# Patient Record
Sex: Female | Born: 1995 | Race: White | Hispanic: No | Marital: Single
Health system: Midwestern US, Academic
[De-identification: ages and names within clinical notes are randomized; demographics above are authoritative.]

---
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
## 2013-02-11 ENCOUNTER — Inpatient Hospital Stay
Admit: 2013-02-11 | Discharge: 2013-02-11 | Disposition: A | Discharge status: Home / Self Care | Payer: PRIVATE HEALTH INSURANCE

## 2013-02-11 DIAGNOSIS — N12 Tubulo-interstitial nephritis, not specified as acute or chronic: Secondary | ICD-10-CM

## 2013-02-11 LAB — URINALYSIS W/RFL TO MICROSCOPIC
Bilirubin, UA: NEGATIVE
Glucose, UA: NEGATIVE mg/dL
Ketones, UA: 5 mg/dL
Nitrite, UA: POSITIVE
Protein, UA: 100 mg/dL
RBC, UA: 13 /HPF (ref 0–3)
Specific Gravity, UA: 1.029 (ref 1.005–1.035)
Squam Epithel, UA: 19 /HPF (ref 0–5)
Urobilinogen, UA: <2 mg/dL (ref 0.2–1.9)
WBC, UA: 86 /HPF (ref 0–5)
pH, UA: 5 (ref 5.0–8.0)

## 2013-02-11 LAB — BASIC METABOLIC PANEL
Anion Gap: 13 mmol/L (ref 3–16)
BUN: 8 mg/dL (ref 7–20)
CO2: 23 mmol/L (ref 21–33)
Calcium: 8.9 mg/dL (ref 8.9–10.4)
Chloride: 105 mmol/L (ref 98–110)
Creatinine: 0.72 mg/dL (ref 0.50–1.20)
GFR MDRD Af Amer: 126 See note.
GFR MDRD Non Af Amer: 104 See note.
Glucose: 87 mg/dL (ref 65–99)
Osmolality, Calculated: 290 mOsm/kg (ref 278–305)
Potassium: 3.8 mmol/L (ref 3.8–5.1)
Sodium: 141 mmol/L (ref 135–146)

## 2013-02-11 LAB — CBC
Hematocrit: 37.1 % (ref 35.0–45.0)
Hemoglobin: 12.1 g/dL (ref 11.7–15.5)
MCH: 26.7 pg (ref 27.0–33.0)
MCHC: 32.5 g/dL (ref 32.0–36.0)
MCV: 82.2 fL (ref 80.0–100.0)
MPV: 9.8 fL (ref 7.5–11.5)
Platelets: 245 10*3/uL (ref 140–400)
RBC: 4.51 10*6/uL (ref 3.80–5.10)
RDW: 13.8 % (ref 11.0–15.0)
WBC: 9.9 10*3/uL (ref 3.8–10.8)

## 2013-02-11 LAB — DIFFERENTIAL
Basophils Absolute: 50 /uL (ref 0–200)
Basophils Relative: 0.5 % (ref 0.0–1.0)
Eosinophils Absolute: 396 /uL (ref 15–500)
Eosinophils Relative: 4 % (ref 0.0–8.0)
Lymphocytes Absolute: 2881 /uL (ref 850–3900)
Lymphocytes Relative: 29.1 % (ref 15.0–45.0)
Monocytes Absolute: 891 /uL (ref 200–950)
Monocytes Relative: 9 % (ref 0.0–12.0)
Neutrophils Absolute: 5683 /uL (ref 1500–7800)
Neutrophils Relative: 57.4 % (ref 40.0–80.0)

## 2013-02-11 LAB — HCG URINE, QUALITATIVE: Preg Test, Ur: NEGATIVE

## 2013-02-11 LAB — URINE CULTURE: Culture Result: >100000

## 2013-02-11 MED ORDER — cephALEXin (KEFLEX) 500 MG capsule
500 | ORAL_CAPSULE | Freq: Four times a day (QID) | ORAL | Status: AC
Start: 2013-02-11 — End: 2013-02-25

## 2013-02-11 MED ORDER — cephALEXin (KEFLEX) capsule 500 mg
500 | Freq: Once | ORAL | Status: AC
Start: 2013-02-11 — End: 2013-02-11
  Administered 2013-02-11: 15:00:00 via ORAL

## 2013-02-11 MED FILL — CEPHALEXIN 500 MG CAPSULE: 500 500 MG | ORAL | Qty: 1

## 2013-02-11 NOTE — Unmapped (Signed)
Take all your antibiotics or the infection may come back.  See your gynecologist in 2 weeks to repeat the urine sample to confirm no infection.    Pyelonephritis, Adult  Pyelonephritis is a kidney infection. In general, there are 2 main types of pyelonephritis:  ?? Infections that come on quickly without any warning (acute pyelonephritis).  ?? Infections that persist for a long period of time (chronic pyelonephritis).  CAUSES   Two main causes of pyelonephritis are:  ?? Bacteria traveling from the bladder to the kidney. This is a problem especially in pregnant women. The urine in the bladder can become filled with bacteria from multiple causes, including:  ?? Inflammation of the prostate gland (prostatitis).  ?? Sexual intercourse in females.  ?? Bladder infection (cystitis).  ?? Bacteria traveling from the bloodstream to the tissue part of the kidney.  Problems that may increase your risk of getting a kidney infection include:  ?? Diabetes.  ?? Kidney stones or bladder stones.  ?? Cancer.  ?? Catheters placed in the bladder.  ?? Other abnormalities of the kidney or ureter.  SYMPTOMS   ?? Abdominal pain.  ?? Pain in the side or flank area.  ?? Fever.  ?? Chills.  ?? Upset stomach.  ?? Blood in the urine (dark urine).  ?? Frequent urination.  ?? Strong or persistent urge to urinate.  ?? Burning or stinging when urinating.  DIAGNOSIS   Your caregiver may diagnose your kidney infection based on your symptoms. A urine sample may also be taken.  TREATMENT   In general, treatment depends on how severe the infection is.   ?? If the infection is mild and caught early, your caregiver may treat you with oral antibiotics and send you home.  ?? If the infection is more severe, the bacteria may have gotten into the bloodstream. This will require intravenous (IV) antibiotics and a hospital stay. Symptoms may include:  ?? High fever.  ?? Severe flank pain.  ?? Shaking chills.  ?? Even after a hospital stay, your caregiver may require you to be on oral  antibiotics for a period of time.  ?? Other treatments may be required depending upon the cause of the infection.  HOME CARE INSTRUCTIONS   ?? Take your antibiotics as directed. Finish them even if you start to feel better.  ?? Make an appointment to have your urine checked to make sure the infection is gone.  ?? Drink enough fluids to keep your urine clear or pale yellow.  ?? Take medicines for the bladder if you have urgency and frequency of urination as directed by your caregiver.  SEEK IMMEDIATE MEDICAL CARE IF:   ?? You have a fever or persistent symptoms for more than 2-3 days.  ?? You have a fever and your symptoms suddenly get worse.  ?? You are unable to take your antibiotics or fluids.  ?? You develop shaking chills.  ?? You experience extreme weakness or fainting.  ?? There is no improvement after 2 days of treatment.  MAKE SURE YOU:  ?? Understand these instructions.  ?? Will watch your condition.  ?? Will get help right away if you are not doing well or get worse.  Document Released: 03/21/2005 Document Revised: 09/20/2011 Document Reviewed: 08/25/2010  ExitCare?? Patient Information ??2014 [place], [place].

## 2013-02-11 NOTE — Unmapped (Signed)
Pt c/o left flank pain since yesterday States just finished antibiotics recently for UTI Denies urinary c/o Also c/o increased thirst

## 2013-02-11 NOTE — Unmapped (Signed)
[facility] ED Note  Date of Service: 02/11/2013  Reason for Visit: Flank Pain      Patient History     HPI:  Jocelyn Schaefer is a 17 y.o. female who presents to the Emergency Department with a chief complaint of left-sided abdominal pain.  Patient with history of UTI 2 months ago finished antibiotics, states that left upper quadrant and left flank pain started yesterday, seem to be worse when she was rolling over while sleeping and lying on the left side.  She denies other associated symptoms such as fever chills nausea vomiting, change in urine or stools.  Denies any dysuria, hematuria, abnormal vaginal discharge.  She has had some irregular menstrual cycles recently and has seen her gynecologist for that.  No recent viral illness infections.  Denies any shortness breath cough or worsening with deep inspiration.       Past Medical History   Diagnosis Date   ??? Depression    ??? Anxiety    ??? Bipolar disorder    ??? Asthma        Past Surgical History   Procedure Laterality Date   ??? Tonsillectomy     ??? Tympanostomy tube placement         No family history on file.    Jocelyn Schaefer  reports that she has never smoked. She does not have any smokeless tobacco history on file. She reports that she does not drink alcohol or use illicit drugs.    Previous Medications    LAMOTRIGINE (LAMICTAL) 150 MG TABLET    Take 150 mg by mouth At bedtime.    UNKNOWN TO PATIENT    Birth control pill daily       Allergies:   Allergies as of 02/11/2013   ??? (No Known Allergies)       Review of Systems     ROS: Negative for fever, chills.  Negative for night sweats or significant weight changes.  Negative for vision change.  Negative for change in bowel or urinary pattern.  All other pertinent systems negative if not other wise specified in HPI.      Physical Exam     General: Well-developed well-nourished in no acute distress.  HEENT: Head normocephalic atraumatic.  Pupils equal and round.  External  ears and nose normal.  No cervical lymphadenopathy.  Neck: Full range of motion, supple.  Pulmonary: Lungs clear to auscultation bilaterally.  No wheezing, rhonchi, or rales.  Cardiac: Regular rate and rhythm.  No murmurs, rubs, or gallops.  Clear S1, S2.  Abdomen: Soft, tenderness to left upper quadrant and left lateral abdominal wall, without splenomegaly, no costovertebral angle tenderness.  No rebound, guarding, or organomegaly noted.  No peritoneal findings.  Musculoskeletal: Moving all extremities appropriately with full range of motion.  No obvious weakness noted.  No pitting edema noted.  Vascular: Palpable pulses to all extremities.  Skin: Warm and dry.  Neuro: Alert and oriented.  Cranial nerves II through XII without deficit.    Psych:  Normal affect, Normal judgement, Normal mood.  Normal affect, and behavior.    ED Course and MDM     MEDICAL DECISION MAKING    RECENT VITALS:  BP: 155/89 mmHg, Temp: 99.2 ??F (37.3 ??C), Heart Rate: 110, Resp: 16     RADIOLOGY:       LABS:   Labs Reviewed   URINALYSIS W/ REFLEX TO MICROSCOPIC - Abnormal; Notable for the following:     Color, UA Amber (*)  Clarity, UA Cloudy (*)     Protein, UA 100 (*)     Ketones, UA 5 (*)     Blood, UA Moderate (*)     Nitrite, UA Positive (*)     Leukocytes, UA Large (*)     RBC, UA 13 (*)     WBC, UA 86 (*)     Squam Epithel, UA 19 (*)     Bacteria, UA Moderate (*)     Mucus, UA Present (*)     All other components within normal limits   CBC - Abnormal; Notable for the following:     MCH 26.7 (*)     All other components within normal limits   URINE CULTURE   BASIC METABOLIC PANEL   DIFFERENTIAL   HCG URINE, QUALITATIVE       MEDS:  Medications   cephALEXin (KEFLEX) capsule 500 mg (not administered)       PROCEDURES: N/A    CONSULTS:  None    MEDICAL DECISION MAKING / ED COURSE:      The patient was seen and examined by myself and presented to Dr. Gae Gallop, MD, who also saw the patient.  IV access obtained.  Unremarkable however  her urinalysis shows large amount of white cells with large leukocytes and positive nitrites.  Culture is pending.  Patient is tolerating oral well and was given a dose of Keflex here.  We'll be discharged home with 14 days Keflex, encouraged to increase oral fluid intake and to follow up with her gynecologist in about 2 weeks have a repeat urinalysis performed to confirm clearance of UTI.  Patient is not pregnant, otherwise appears well, no need for IV antibiotics or admission to the hospital for this.  Encouraged to finish the entire course of antibiotics.      The patient tolerated their visit well.  They were seen and evaluated by the attending physician who agreed with the assessment and plan.  The patient and / or the family were informed of the results of any tests, a time was given to answer questions, a plan was proposed and they agreed with plan.      DISCHARGE DIAGNOSIS:  1. Pyelonephritis        PATIENT REFERRED TO:  No Pcp          [facility] Emergency Department  [address]  [place] [address]  [phone]    If symptoms worsen      DISCHARGE MEDICATIONS:  New Prescriptions    CEPHALEXIN (KEFLEX) 500 MG CAPSULE    Take 1 capsule (500 mg total) by mouth every 6 hours.         Critical Care Time (Attendings)           [place] Michalla Ringer, [place]  02/11/13 [phone]

## 2013-02-11 NOTE — Unmapped (Signed)
ED Attending Attestation Note    Date of service:  02/11/2013    This patient was seen by the mid-level provider.  I have seen and examined the patient, agree with the workup, evaluation, management and diagnosis.  The care plan has been discussed and I concur.      My assessment reveals a 17 y.o. female likely bilateral and otherwise healthy young female.  Discharged home abx

## 2013-02-13 NOTE — Unmapped (Signed)
Emergency Department Follow Up Note: Urine Culture    Marae Cottrell is a 17 y.o. female who had the following results from urine cultures collected while in the [facility] Emergency Department:     Culture Result Escherichia coli      Culture Result >100,000 cfu/mL      Resulting Agency [facility]      Culture & Susceptibility       Antibiotic  Organism Organism Organism       ESCHERICHIA COLI       AMPICILLIN  4 S Final         AMPICILLIN/SULBACTAM  <=2 S Final         CEFAZOLIN  <=4 S Final         CEFTRIAXONE  <=1 S Final         CIPROFLOXACIN  <=0.25 S Final         GENTAMICIN  <=1 S Final         NITROFURANTOIN  32 S Final         PIPERACILLIN/TAZOBACTAM  <=4 S Final         SULFA/TRIMETHOPRIM  <=20 S Final         TOBRAMYCIN  <=1 S Final              Patient was discharged on the following medications:   Discharge Medication List as of 02/11/2013  9:41 AM      START taking these medications    Details   cephALEXin (KEFLEX) 500 MG capsule Take 1 capsule (500 mg total) by mouth every 6 hours., Starting 02/11/2013, Last dose on Mon 02/25/13, Print              Patient has accurate antibiotic coverage for this UTI. No further action at this time.

## 2013-05-06 DIAGNOSIS — R45851 Suicidal ideations: Secondary | ICD-10-CM

## 2013-05-06 NOTE — Unmapped (Signed)
Bed: C24W  Expected date:   Expected time:   Means of arrival:   Comments:  [facility] intake SI

## 2013-05-06 NOTE — Unmapped (Signed)
Pt states i feel like i want to kill myself. States she has been feeling this way for a few weeks due to high anxiety. States she planned on drinking bleach, denies doing any harm to self.

## 2013-05-07 ENCOUNTER — Inpatient Hospital Stay
Admit: 2013-05-07 | Discharge: 2013-05-07 | Disposition: A | Discharge status: Other Acute Inpatient Hospital | Payer: PRIVATE HEALTH INSURANCE

## 2013-05-07 LAB — CBC
Hematocrit: 36.6 % (ref 35.0–45.0)
Hemoglobin: 11.9 g/dL (ref 11.7–15.5)
MCH: 26.3 pg (ref 27.0–33.0)
MCHC: 32.6 g/dL (ref 32.0–36.0)
MCV: 80.6 fL (ref 80.0–100.0)
MPV: 10.1 fL (ref 7.5–11.5)
Platelets: 301 10*3/uL (ref 140–400)
RBC: 4.54 10*6/uL (ref 3.80–5.10)
RDW: 13.5 % (ref 11.0–15.0)
WBC: 10.1 10*3/uL (ref 3.8–10.8)

## 2013-05-07 LAB — BASIC METABOLIC PANEL
Anion Gap: 8 mmol/L (ref 3–16)
BUN: 9 mg/dL (ref 7–25)
CO2: 25 mmol/L (ref 21–31)
Calcium: 9.5 mg/dL (ref 8.6–10.3)
Chloride: 105 mmol/L (ref 98–110)
Creatinine: 0.67 mg/dL (ref 0.60–1.30)
GFR MDRD Af Amer: 137 See note.
GFR MDRD Non Af Amer: 113 See note.
Glucose: 98 mg/dL (ref 70–100)
Osmolality, Calculated: 285 mosm/kg (ref 278–305)
Potassium: 3.9 mmol/L (ref 3.5–5.3)
Sodium: 138 mmol/L (ref 135–146)

## 2013-05-07 LAB — URINE DRUG SCREEN WITHOUT CONFIRMATION, STAT
Amphetamines UR, 1000 ng/mL Cutoff: NEGATIVE
Barbiturates UR, 300  ng/mL Cutoff: NEGATIVE
Benzodiazepines UR, 300 ng/mL Cutoff: NEGATIVE
Cocaine UR, 300 ng/mL Cutoff: NEGATIVE
MDMA URINE: NEGATIVE
Methadone, UR, 300 ng/mL Cutoff: NEGATIVE
Methamph, UR, 1000 ng/mL Cutoff: NEGATIVE
Opiates UR, 300 ng/mL Cutoff: NEGATIVE
Oxycodone UR: NEGATIVE
Phencyclidine (PCP) UR, 25 ng/mL Cutoff: NEGATIVE
THC UR, 50 ng/mL Cutoff: NEGATIVE
Tricyclic Antidepressants Screen,Urine 1000 ng/mL Cutoff: NEGATIVE

## 2013-05-07 LAB — URINE CULTURE: Culture Result: >100000

## 2013-05-07 LAB — URINALYSIS W/RFL TO MICROSCOPIC
Bilirubin, UA: NEGATIVE
Glucose, UA: NEGATIVE mg/dL
Ketones, UA: NEGATIVE mg/dL
Nitrite, UA: NEGATIVE
Protein, UA: 30 mg/dL
RBC, UA: 16 /HPF (ref 0–3)
Specific Gravity, UA: 1.025 (ref 1.005–1.035)
Squam Epithel, UA: 1 /HPF (ref 0–5)
Urobilinogen, UA: 4 mg/dL (ref 0.2–1.9)
WBC, UA: 1 /HPF (ref 0–5)
pH, UA: 7 (ref 5.0–8.0)

## 2013-05-07 LAB — SALICYLATE LEVEL: Salicylate Lvl: <3 mg/dL (ref 10–30)

## 2013-05-07 LAB — HCG URINE, QUALITATIVE: Preg Test, Ur: NEGATIVE

## 2013-05-07 LAB — ACETAMINOPHEN LEVEL: Acetaminophen Level: <10 ug/mL — ABNORMAL LOW (ref 10–30)

## 2013-05-07 MED ORDER — cephALEXin (KEFLEX) capsule 500 mg
500 | Freq: Once | ORAL | Status: AC
Start: 2013-05-07 — End: 2013-05-07
  Administered 2013-05-07: 07:00:00 500 mg via ORAL

## 2013-05-07 MED ORDER — sodium chloride 0.9 % 1,000 mL bolus
Freq: Once | INTRAVENOUS | Status: AC
Start: 2013-05-07 — End: 2013-05-07
  Administered 2013-05-07: 05:00:00 via INTRAVENOUS

## 2013-05-07 MED FILL — CEPHALEXIN 500 MG CAPSULE: 500 500 MG | ORAL | Qty: 1

## 2013-05-07 MED FILL — SODIUM CHLORIDE 0.9 % INTRAVENOUS SOLUTION: INTRAVENOUS | Qty: 1000

## 2013-05-07 NOTE — Unmapped (Signed)
Pt resting, friend at bedside, sitter for close obs. No needs voiced, no distress noted, call light in reach.

## 2013-05-07 NOTE — Unmapped (Signed)
Suicidal Feelings, How to Help Yourself  Everyone feels sad or unhappy at times, but depressing thoughts and feelings of hopelessness can lead to thoughts of suicide. It can seem as if life is too tough to handle. If you feel as though you have reached the point where suicide is the only answer, it is time to let someone know immediately.   HOW TO COPE AND PREVENT SUICIDE  ?? Let family, friends, teachers, or counselors know. Get help. Try not to isolate yourself from those who care about you. Even though you may not feel sociable, talk with someone every day. It is best if it is face-to-face. Remember, they will want to help you.  ?? Eat a regularly spaced and well-balanced diet.  ?? Get plenty of rest.  ?? Avoid alcohol and drugs because they will only make you feel worse and may also lower your inhibitions. Remove them from the home. If you are thinking of taking an overdose of your prescribed medicines, give your medicines to someone who can give them to you one day at a time. If you are on antidepressants, let your caregiver know of your feelings so he or she can provide a safer medicine, if that is a concern.  ?? Remove weapons or poisons from your home.  ?? Try to stick to routines. Follow a schedule and remind yourself that you have to keep that schedule every day.  ?? Set some realistic goals and achieve them. Make a list and cross things off as you go. Accomplishments give a sense of worth. Wait until you are feeling better before doing things you find difficult or unpleasant to do.  ?? If you are able, try to start exercising. Even half-hour periods of exercise each day will make you feel better. Getting out in the sun or into nature helps you recover from depression faster. If you have a favorite place to walk, take advantage of that.  ?? Increase safe activities that have always given you pleasure. This may include playing your favorite music, reading a good book, painting a picture, or playing your favorite  instrument. Do whatever takes your mind off your depression.  ?? Keep your living space well-lighted.  GET HELP  Contact a suicide hotline, crisis center, or local suicide prevention center for help right away. Local centers may include a hospital, clinic, community service organization, social service provider, or health department.  ?? Call your local emergency services (911 in the [place]).  ?? Call a suicide hotline:  ?? 1-800-273-TALK (1-800-273-8255) in the [place].  ?? 1-800-SUICIDE (1-800-784-2433) in the [place].  ?? 1-888-628-9454 in the [place] for Spanish-speaking counselors.  ?? 1-800-799-4TTY (1-800-799-4889) in the [place] for TTY users.  ?? Visit the following websites for information and help:  ?? National Suicide Prevention Lifeline: www.suicidepreventionlifeline.org  ?? Hopeline: www.hopeline.com  ?? American Foundation for Suicide Prevention: www.afsp.org  ?? For lesbian, gay, bisexual, transgender, or questioning youth, contact The Trevor Project:  ?? 1-866-4-U-TREVOR (1-866-488-7386) in the [place].  ?? www.thetrevorproject.org  ?? In [place], treatment resources are listed in each province with listings available under [employer] or similar titles. Another source for Crisis Centres by Province is located at http://www.suicideprevention.ca/in-crisis-now/find-a-crisis-centre-now/crisis-centres  Document Released: 09/25/2002 Document Revised: 06/13/2011 Document Reviewed: 02/13/2007  ExitCare?? Patient Information ??2014 [employer].

## 2013-05-07 NOTE — Unmapped (Signed)
Mobile care to arrive ~0315. Pt updated on POC. Pt continues to be closely monitored.

## 2013-05-07 NOTE — Unmapped (Signed)
Report to mobile care team

## 2013-05-07 NOTE — Unmapped (Signed)
Pt accepted at [facility] by Felipa Eth MD

## 2013-05-07 NOTE — Unmapped (Signed)
Pt received ~357ml of fluids, called this RN in and requested that fluids be d/c. Fluids d/c- MD aware

## 2013-05-07 NOTE — Unmapped (Signed)
Spoke with intake Charity fundraiser at [facility] center. She is to discuss pt with MD and call back.

## 2013-05-07 NOTE — Unmapped (Signed)
[facility] ED Note    Date of Service:  05/06/2013    Reason for Visit: Suicidal      Patient History     HPI: Jocelyn Schaefer is a 18 y.o. female who presents with suicidal ideation.  The patient states that she has had multiple episodes in the past suicidal ideation.  She carries a diagnosis of anxiety and depression according to her bipolar disease as well.  She states that she has had episodes of self harmful behavior in the past including cutting her wrists.  She has not attempted to kill herself and this way however she has done this to relieve stress.  The most recent was in August of 2014 per her description.  She describes most recently violent mood swings as well as increasing suicidal ideation.  Her plan at this time is something of that nature of taking sleeping pills and going to lay down in a cold creek behind her house or at work she contemplates drinking bleach.  She really has had some dysuria recently was seen in an urgent care who put her on ciprofloxacin which she has not filled her symptoms improve.  She denies any recent fevers.  She has never made a full attempt to commit suicide in the past.  She has been seen at [facility] or [facility] before.  She describes some mild lower bowel pain related to the urinary tract infection.    Past Medical History   Diagnosis Date   ??? Depression    ??? Anxiety    ??? Bipolar disorder    ??? Asthma        Past Surgical History   Procedure Laterality Date   ??? Tonsillectomy     ??? Tympanostomy tube placement          reports that she has been smoking.  She does not have any smokeless tobacco history on file. She reports that she does not drink alcohol or use illicit drugs.    Previous Medications    LAMOTRIGINE (LAMICTAL) 150 MG TABLET    Take 150 mg by mouth At bedtime.       Allergies:   Allergies as of 05/06/2013   ??? (No Known Allergies)       Review of Systems      A ten point review of systems was performed and found to be  negative with the exception of those things mentioned in the history of present illness.    Physical Exam     ED Triage Vitals   Vital Signs Group      Temp 05/06/13 2249 98.9 ??F (37.2 ??C)      Temp Source 05/06/13 2249 Oral      Heart Rate 05/06/13 2249 123      Heart Rate Source 05/06/13 2249 Monitor      Resp 05/06/13 2249 20      BP 05/06/13 2249 173/93 mmHg      BP Location 05/06/13 2249 Right arm      BP Method 05/06/13 2249 Automatic      Patient Position 05/06/13 2249 Lying   SpO2 05/06/13 2249 98 %   O2 Device 05/06/13 2249 None (Room air)       General: Well appearing.  No acute distress.  HEENT:  Normocephalic, atraumatic.  Moist mucus membranes.  Oropharynx is clear, uvula midline, tonsils are unremarkable.  Neck:  Trachea midline, no JVD, no meningismus   Eyes:  PERRL, EOMI, anicteric sclera  Psych:  Normal affect  MSK:  No long bone deformities, wrist scars  Pulm:  Clear to auscultation bilaterally.  No wheezes, rales, or rhonchi.  Cardiac: Mildly tachycardic rate and rhythm; no murmurs, rubs, or gallops. 2+pulses in the upper extremities.  Abdomen: soft, minimally tender in the low abdomen, no rebound, no guarding  Neuro:  Awake, alert, and oriented x 3.  Moving all extremities.  GCS 15.    Diagnostic Studies     All laboratory and radiologic examinations performed in the emergency department were reviewed.  Important findings are discussed below.    Labs:   Please see electronic medical record for any tests performed in the ED    Radiology:    Please see electronic medical record for any tests performed in the ED    EKG: (As interpreted by me in the absence of the cardiologist) normal sinus rhythm rate of 96 PR 1:30 QRS 82 QTC 444 normal axis no pathologic Q waves are progression no ST segment elevation no pathologic T wave inversions no interventricular conduction delay or dysrhythmia no Brugada pattern no delta waves no prolongation of the QTC or QRS      Emergency Department Procedures          Consultations         ED Course and MDM     Diagnosis:   1) suicidal ideation  2) UTI    Patient is well-appearing.  She is afebrile as his normal vital signs.  She has not an actual attempt to harm herself today she simply has significant ideation with plan.  Chest significant history for the same.  I do feel she does require further psychiatric evaluation and 72 hour hold has been placed on her.  She denies any actual self harming behavior and her laboratory and other workup studies and physical exam support this.  She continues to have urinary symptoms and has a urine which is not clearly infected but does show significant bacteriuria as such culture will be sent the patient will be treated.  Coingestion labs and other basic workup also demonstrates no concerning findings.  She will be transferred to the [facility] center at this time for further evaluation from a psychiatric perspective.      Critical Care Time (Attendings)           Kristine Garbe, MD  05/07/13 1610

## 2013-05-08 NOTE — Unmapped (Signed)
[facility] ED Culture/Lab follow-up  Final Result - Nothing to do.    urine culture: Mixed Skin/Urogenital Flora.

## 2013-08-25 DIAGNOSIS — R45851 Suicidal ideations: Secondary | ICD-10-CM

## 2013-08-26 ENCOUNTER — Inpatient Hospital Stay
Admit: 2013-08-26 | Discharge: 2013-08-26 | Disposition: A | Discharge status: Home / Self Care | Payer: PRIVATE HEALTH INSURANCE | Attending: Psychiatry

## 2013-08-26 ENCOUNTER — Inpatient Hospital Stay
Admit: 2013-08-26 | Discharge: 2013-08-26 | Disposition: A | Discharge status: Other Acute Inpatient Hospital | Payer: PRIVATE HEALTH INSURANCE

## 2013-08-26 DIAGNOSIS — F3130 Bipolar disorder, current episode depressed, mild or moderate severity, unspecified: Secondary | ICD-10-CM

## 2013-08-26 LAB — URINALYSIS W/RFL TO MICROSCOPIC
Bilirubin, UA: NEGATIVE
Glucose, UA: NEGATIVE mg/dL
Ketones, UA: NEGATIVE mg/dL
Nitrite, UA: NEGATIVE
Protein, UA: NEGATIVE mg/dL
RBC, UA: 7 /HPF (ref 0–3)
Specific Gravity, UA: 1.026 (ref 1.005–1.035)
Squam Epithel, UA: 5 /HPF (ref 0–5)
Trans Epithel, UA: <1 /HPF (ref 0–5)
Urobilinogen, UA: 2 mg/dL (ref 0.2–1.9)
WBC, UA: 2 /HPF (ref 0–5)
pH, UA: 5 (ref 5.0–8.0)

## 2013-08-26 LAB — CBC
Hematocrit: 37.9 % (ref 35.0–45.0)
Hemoglobin: 12.7 g/dL (ref 11.7–15.5)
MCH: 27.3 pg (ref 27.0–33.0)
MCHC: 33.5 g/dL (ref 32.0–36.0)
MCV: 81.4 fL (ref 80.0–100.0)
MPV: 9 fL (ref 7.5–11.5)
Platelets: 277 10*3/uL (ref 140–400)
RBC: 4.65 10*6/uL (ref 3.80–5.10)
RDW: 15.1 % (ref 11.0–15.0)
WBC: 10.6 10*3/uL (ref 3.8–10.8)

## 2013-08-26 LAB — BASIC METABOLIC PANEL
Anion Gap: 5 mmol/L (ref 3–16)
BUN: 10 mg/dL (ref 7–25)
CO2: 25 mmol/L (ref 21–33)
Calcium: 9.3 mg/dL (ref 8.6–10.3)
Chloride: 106 mmol/L (ref 98–110)
Creatinine: 0.73 mg/dL (ref 0.60–1.30)
GFR MDRD Af Amer: 123 See note.
GFR MDRD Non Af Amer: 102 See note.
Glucose: 102 mg/dL (ref 70–100)
Osmolality, Calculated: 281 mOsm/kg (ref 278–305)
Potassium: 4.1 mmol/L (ref 3.5–5.3)
Sodium: 136 mmol/L (ref 133–146)

## 2013-08-26 LAB — ACETAMINOPHEN LEVEL: Acetaminophen Level: <10 ug/mL (ref 10–30)

## 2013-08-26 LAB — DIFFERENTIAL
Basophils Absolute: 42 /uL (ref 0–200)
Basophils Relative: 0.4 % (ref 0.0–1.0)
Eosinophils Absolute: 435 /uL (ref 15–500)
Eosinophils Relative: 4.1 % (ref 0.0–8.0)
Lymphocytes Absolute: 3053 /uL (ref 850–3900)
Lymphocytes Relative: 28.8 % (ref 15.0–45.0)
Monocytes Absolute: 678 /uL (ref 200–950)
Monocytes Relative: 6.4 % (ref 0.0–12.0)
Neutrophils Absolute: 6392 /uL (ref 1500–7800)
Neutrophils Relative: 60.3 % (ref 40.0–80.0)

## 2013-08-26 LAB — URINE CULTURE

## 2013-08-26 LAB — HCG URINE, QUALITATIVE: Preg Test, Ur: NEGATIVE

## 2013-08-26 LAB — TSH: TSH: 1.82 u[IU]/mL (ref 0.34–5.60)

## 2013-08-26 LAB — CHLAMYDIA / GONORRHOEAE DNA URINE
Chlamydia Trachomatis DNA Urine: NEGATIVE
Neisseria gonorrhoeae DNA Urine: NEGATIVE

## 2013-08-26 LAB — SALICYLATE LEVEL: Salicylate Lvl: <3 mg/dL (ref 10–30)

## 2013-08-26 LAB — SYPHILIS MONITORING (RPR W TITER): RPR Monitoring Screen: NONREACTIVE

## 2013-08-26 NOTE — Unmapped (Signed)
72hr hold signed by Emergency planning/management officer; MD notified

## 2013-08-26 NOTE — Unmapped (Signed)
18 y.o.White or Caucasian female transferred from [place]. after locking herself in bathroom at work. Pt. states she was recently promoted to Control and instrumentation engineer at [employer] and became overwhelmed last night when employees called off on 3rd shift. Pt. states she is diagnosed bipolar and has had two psychiatric in patient admits at [facility].  Pt. States I became overwhelmed at work and started to have invasive suicidal thoughts so I locked myself in the bathroom. Pt. isn't sure if she is currently suicidal.   Pt. Is pleasant, calm and cooperative.  Pt. states she also obsesses over health issues and checks her skin and eyes constantly because she thinks she might be sick. denies A/V hallucinations. Vague about S.I. Denies H.I.    PSYCH HX: 2 in patient admits to [facility], several [employer]. visits    OUTPT AGENCY: none  CM:    MD:    MEDICAL:asthma    SUBSTANCE:denies    FAMILY PSYCH:maternal Aunt is diagnosed bipolar    SOCIAL ZO:XWRUE with her mother and stepfather, works full time at [employer], graduated from high school    LEGAL:denies    CONTRAINDICATION TO RESTRAINT: none      Appearance: dressed in hospital gowns, clean, has nose ring in   Mood: anxious  Affect:appropriate  Speech:appropriate  Thought content/process:alert and oriented x4, denies A/V hallucinations, describes intrusive thoughts she can't control.  Sleep:no sleep issues

## 2013-08-26 NOTE — Unmapped (Signed)
Pt states she had a mental breakdown while at work; States she does not deserve to live; Wants to go home and slit her wrists

## 2013-08-26 NOTE — Unmapped (Signed)
[facility] Psychiatric Emergency  Service Evaluation    Reason for Visit/Chief Complaint: Suicidal    Patient History     Context: stress  Location: Altered mental status of mood  Duration: 3 days.  Severity: mild .  Associated Symptoms: mild .  Modifying Factors: medication noncompliance .    Past Psychiatric History: Pt has been dx with Bipolar, Anxiety disorder and rule out ADD.  Said she is seeking indiv therapy with Virgina Jock, PhD at [facility] [place], [place]  Pt thinks Dr. Excell Seltzer is also  sort of NP and she is able to prescribe medicine.  She currently took off and on Lexapro 10mg  po q daily and Adderall 20mg  po bid ( said she took only as needed).  Denies any serious previous suicide attempts.   Denies any cutting behavior in the past, except when she was 13 or 14, said few times, no scars left in bilateral wrist in exam.    Family psych h/o: Pt said her mother might be also bipolar, said she did not seek tx, maternal aunt bipolar     HPI: Jocelyn Schaefer ( preferred to be called Jocelyn Schaefer), is a 18 years old single WF with past psych h/o Bipolar brought here to [facility] by CPD after pt self locked up in the bathroom at [employer] after she was overwhelmed at work.  Pt said she got overwhelmed, and locked herself in bathroom, feeling suicidal. York Spaniel one of the customer, female, was EMT and she tried to knock on the door, call 911 and brought her to [facility] From there, pt as transferred here to psych for evaluation.  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  Said she can still able to keep a job, already talked with Cytogeneticist and hope that she can go home now.  Denies current suicidal or homicidal thoughts or plan or intent. Said she took Lexapro irregularly, but she thinks it helps. She told me that she should take more  regular. Pt also said she took Addreall 20mg  po bid as needed for her attention.  Denies any mood swings at present time , denies any auditory and or visual hallucination.    Social History:    Family History   Problem Relation Age of Onset   ??? Bipolar disorder Maternal Aunt      Work History:  employed    History     Social History   ??? Marital Status: Single     Spouse Name: N/A     Number of Children: None    ??? Years of Education: [employer] graduate     Social History Main Topics   ??? Smoking status: Current Some Day Smoker   ??? Smokeless tobacco: Never Used   ??? Alcohol Use: Occassional, pt said she tried not to drink because when she drinks, she meant to drink    ??? Drug Use: No   ??? Sexual Activity: Not Currently     Partners: Male     Other Topics Concern   ??? LMP: April 2015, said her periods is regularly irregular, she denies using OCP or sexually active.     Social History Narrative   ??? Pt lives with her mom, step dad and her 40yrs old half brother in [place] . Pt's mom also works  full time, no problem with step dad . Said she did not get along with her 65yrs old half brother sometimes.   Pt biological father also remarried and he lives in [place], works full time, said she gets along with dad and his wife too. They have two children age 83yrs old son and 57 yrs old half daughter: Pt said she gets along better with her older half siblings from dad site.  Pt also works full time at [employer], currently promoted to Public affairs consultant level.  Pt went to [employer], [employer] school, owed 17,000 $ in debt, did not finish the school, get stressed out and ends up in ER in 05/2013.        Past Medical History   Diagnosis Date   ??? Depression    ??? Anxiety    ??? Bipolar disorder    ??? Asthma      Previous Medications    DEXTROAMPHETAMINE-AMPHETAMINE (AMPHETAMINE-DEXTROAMPHETAMINE) 20 MG TAB    Take 20 mg by mouth 2 times a day with meals.    ESCITALOPRAM OXALATE (LEXAPRO) 5 MG TABLET    Take 10 mg by mouth daily. Unsure of dosage           Allergies:   Allergies as of 08/26/2013   ??? (No Known Allergies)       Review of Systems     Review of Systems   All other systems reviewed and are negative.      Physical Exam/Objective Data     ED Triage Vitals   Vital Signs Group      Temp 08/26/13 1010 97.6 ??F (36.4 ??C)      Temp Source 08/26/13 1010 Oral      Heart Rate 08/26/13 1010 74      Heart Rate Source 08/26/13 1010 Automatic      Resp 08/26/13 1010 14      SpO2 08/26/13 1010 98 %      BP 08/26/13 1010 125/70 mmHg      BP Location 08/26/13 1010 Left arm      BP Method 08/26/13 1010 Automatic      Patient Position 08/26/13 1010 Sitting   SpO2 08/26/13 1010 98 %   O2 Device 08/26/13 1010 None (Room air)       Physical Exam   Nursing note and vitals reviewed.      Mental Status Exam:     Gait and Muscle Strength:  Normal  Appearance and Behavior: Calm, Cooperative and Open Historian      Groomed  Speech: NL articulation, prosody, volume and production  Mood: euthymic and appropriate  Affect: appropriate  Thought Process and Associations: goal directed and no derailment       No loose associations  Thought Content: no suicidal/homicidal without plan  Perceptual Abnormalities: None  Orientation: person, place, time/date, situation, day of week, month of year and year  Memory/Attention: recent, remote, and immediate recall intact  Abstraction: Attention and concentration intact  Fund of Knowledge: average  Insight and Judgement: Full     Good  Labs:    Please see electronic medical record for any tests performed in the ED.      Radiology:    Please see electronic medical record for any studies performed in the ED.    Emergency Course and Plan     Jocelyn Schaefer is a 18 y.o. female who presented to the emergency department with Suicidal       Diagnosis:    Axis I:  Bipolar, Depressed             Anxiety Disorder             Tobacco Use Disorder     Axis II: No diagnosis    Axis III:  None Known     Axis IV: Problem related to the social environment, Problem  with employment, Occupational problems, Economic problems and Problems accessing health care services    Axis V: 61-70 mild symptoms, generally functioning well      Disposition:      Discharged from the ED. See AVS for prescriptions, followup, and discharge instructions.     @SWRPT @        Serrita Lueth Irven Coe, MD  08/26/13 1151

## 2013-08-26 NOTE — Unmapped (Signed)
Sitter remains with pt

## 2013-08-26 NOTE — Unmapped (Signed)
[facility] ACCEPT NOTE:    I spoke with the emergency room physician regarding this patient, have reviewed their chart, and accepted them for transfer from [facility] to [facility] pending the following: EKG, draw urine culture, urine GC/CT, TSH, RPR    Clinical Info: 18yoF p/w SI with plan to overdose on meds or slit wrists.  Hx of similar episodes with admits to [facility].    Kate Sable MD          HPI  Review of Systems  Physical Exam      Duwayne Heck, MD  Resident  08/26/13 [phone]

## 2013-08-26 NOTE — Unmapped (Signed)
Pt asleep

## 2013-08-26 NOTE — Unmapped (Signed)
[facility] ED Note    08/25/2013    Patient History     HPI: Jocelyn Schaefer is a 18 y.o. female who presents with Suicidal  .  Patient brought in by police on a 72 hour hold  Patient is very common reasonable she states that she is having pervasive suicidal thoughts where she wants to take a handful of pills or slit her wrists.  She has had several admissions to the [facility] for similar suicidal thoughts in the past and says that she does not feel safe at home.    Patient was at work today said she had a breakdown, in the bathroom and police were called due to concern that she might be attempting to hurt herself.  She denies ingestions, denies pregnancy       Past Medical History   Diagnosis Date   ??? Depression    ??? Anxiety    ??? Bipolar disorder    ??? Asthma        Past Surgical History   Procedure Laterality Date   ??? Tonsillectomy     ??? Tympanostomy tube placement          reports that she has been smoking.  She does not have any smokeless tobacco history on file. She reports that she does not drink alcohol or use illicit drugs.    Previous Medications    ESCITALOPRAM OXALATE (LEXAPRO) 5 MG TABLET    Take 5 mg by mouth daily. Unsure of dosage    LAMOTRIGINE (LAMICTAL) 150 MG TABLET    Take 200 mg by mouth At bedtime.          Allergies:   Allergies as of 08/25/2013   ??? (No Known Allergies)       Review of Systems     ROS: See HPI for pertinent positives  All other ROS were negative.    Physical Exam     ED Triage Vitals   Vital Signs Group      Temp 08/26/13 0003 97.6 ??F (36.4 ??C)      Temp Source 08/26/13 0003 Oral      Heart Rate 08/26/13 0003 92      Heart Rate Source 08/26/13 0003 Monitor      Resp 08/26/13 0003 16      SpO2 08/26/13 0003 98 %      BP 08/26/13 0003 137/78 mmHg      BP Location 08/26/13 0003 Right arm      BP Method 08/26/13 0003 Automatic      Patient Position 08/26/13 0003 Lying   SpO2 08/26/13 0003 98 %   O2 Device 08/26/13 0003 None  (Room air)     Filed Vitals:    08/26/13 0003   BP: 137/78   Pulse: 92   Temp: 97.6 ??F (36.4 ??C)   TempSrc: Oral   Resp: 16   Height: 5' 7 (1.702 m)   Weight: 230 lb (104.327 kg)   SpO2: 98%      Temp Readings from Last 2 Encounters:   08/26/13 97.6 ??F (36.4 ??C) Oral   05/07/13 98.8 ??F (37.1 ??C)      BP Readings from Last 2 Encounters:   08/26/13 137/78   05/07/13 134/72     Pulse Readings from Last 2 Encounters:   08/26/13 92   05/07/13 76        Constitutional: Well-appearing, calm, no acute distress, non-toxic appearance   Eyes:  Sclera anicteric, conjunctiva normal.  HEENT:  Atraumatic, external ears normal, oropharynx moist.   Neck: normal range of motion, supple.  Respiratory:  No respiratory distress, No excessory muscle use  Cardiovascular:  Regular rate, 2+ pulse = BL radial.  GI:  Soft, nondistended, no rebound, no guarding.   Musculoskeletal:  No edema, no deformities.   Skin:  No rash or nodules noted.   Neurologic:  Awake and alert.  Moves all four extremities.  Light touch intact.    Psychiatric:  Normal mood.  Behavior appropriate.  Good eye contact      Diagnostic Studies     Labs:      Labs Reviewed   BASIC METABOLIC PANEL - Abnormal; Notable for the following:     Glucose 102 (*)     All other components within normal limits   CBC - Abnormal; Notable for the following:     RDW 15.1 (*)     All other components within normal limits   URINALYSIS W/ REFLEX TO MICROSCOPIC - Abnormal; Notable for the following:     Blood, UA Small (*)     Urobilinogen, UA 2.0 (*)     Leukocytes, UA Moderate (*)     RBC, UA 7 (*)     Bacteria, UA Occasional (*)     Mucus, UA Present (*)     Amorphous, UA Present (*)     All other components within normal limits   ACETAMINOPHEN LEVEL - Abnormal; Notable for the following:     Acetaminophen Level <10 (*)     All other components within normal limits   SALICYLATE LEVEL - Abnormal; Notable for the following:     Salicylate Lvl <3 (*)     All other components within normal  limits   DIFFERENTIAL   HCG URINE, QUALITATIVE       Radiology:  SUICIDE PRECAUTIONS      EKG interpretation: None performed    Emergency Department Procedures         ED Course and MDM     Jocelyn Schaefer is a 18 y.o. female who presented to the emergency department with 72 hour hold for her suicidality.  Her recent suicidal thoughts and states that she is a danger to herself.  She states she wishes to be transferred to psychiatric facility for further evaluation    Meds given in ED or prescribed for discharge:  Medications - No data to display    Clinical Impression:  No diagnosis found.    Patient Referred to:    No follow-up provider specified.    Discharge Medications:  New Prescriptions    No medications on file             Critical Care Time (Attendings)         Zerick Prevette, MD  08/26/13 1914

## 2013-08-26 NOTE — Unmapped (Signed)
Sitter remains at bedside

## 2013-08-26 NOTE — Unmapped (Addendum)
Pt sleeping, resp even and easy. Sitter at bedside.

## 2013-08-26 NOTE — Unmapped (Signed)
Manic Depression (Bipolar Disorder)  Bipolar disorder is also known as manic depressive illness. It is when the brain does not function properly and causes shifts in a person's moods, energy and ability to function in everyday life. These shifts are different from the normal ups and downs that everyone experiences. Instead the shifts are severe. If this goes untreated, the person's life becomes more and more disorderly. People with this disorder can be treated can lead full and productive lives. This disorder must be managed throughout life.   SYMPTOMS   ?? Bipolar disorder causes dramatic mood swings. These mood swings go in cycles. They cycle from extreme highs and irritable to deep lows of sadness and hopelessness.  ?? Between the extreme moods, there are usually periods of normal mood.  ?? Along with the mood shifts, the person will have severe changes in energy and behavior. The periods of highs and lows are called episodes of mania and depression.  Signs of mania:  ?? Lots of energy, activity and restlessness.  ?? Extreme high or good mood.  ?? Extreme irritability.  ?? Racing thoughts and talking very fast.  ?? Jumping from one idea to another.  ?? Not able to focus, easily distracted.  ?? Little need to sleep.  ?? Grand beliefs in one's abilities and powers.  ?? Spending sprees.  ?? Increased sexual drive. This can result in many sexual partners.  ?? Poor judgment.  ?? Abuse of drugs, particularly cocaine, alcohol, and sleeping medication.  ?? Aggressive or provocative behavior.  ?? A lasting period of behavior that is different from usual.  ?? Denial that anything is wrong.  *A manic episode is identified if a high mood happens with three or more of the other symptoms lasting most of the day, nearly everyday for a week or longer. If the mood is more irritable in nature, four additional symptoms must be present.  Signs of depression:  ?? Lasting feelings of sadness, anxiety, or empty mood.  ?? Feelings of  hopelessness with negative thoughts.  ?? Feelings of guilt, worthlessness, or helplessness.  ?? Loss of interest or pleasure in activities once enjoyed, including sex.  ?? Feelings of fatigue or having less energy.  ?? Trouble focusing, making decisions, remembering.  ?? Feeling restless or irritable.  ?? Sleeping too little or too much.  ?? Change in eating with possible weight gain or loss.  ?? Feeling ongoing pain that is not caused by physical illness or injury.  ?? Thoughts of death or suicide or suicide attempts.  *A depressive episode is identified as having five or more of the above symptoms that last most of the day, nearly everyday for two weeks or longer.  CAUSES   ?? Research shows that there is no single cause for the disorder. Many factors act together to produce the illness.  ?? This can be passed down from family (hereditary).  ?? Environment may play a part.  TREATMENT   ?? Long-term treatment is strongly recommended because bipolar disorder is a repeated illness. This disorder is better controlled if treatment is ongoing than if it is off and on.  ?? A combination of medication and talk therapy is best for managing the disorder over time.  ?? Medication.  ?? Medication can be prescribed by a doctor that is an expert in treating mental disorders (psychiatrists). Medications known as mood stabilizers are usually prescribed to help control the illness. Other medications can be added when needed. These medicines usually treat episodes   of mania or depression that break through despite the mood stabilizer.  ?? Talk Therapy.  ?? Along with medication, some forms of talk therapy are helpful in providing support, education and guidance to people with the illness and their families. Studies show that this type of treatment increases mood stability, decreases need for hospitalization and improves how they function society.  ?? Electroconvulsive Therapy (ECT).  ?? In extreme situations where the above treatments do not work or  work too slowly to relieve severe symptoms, ECT may be considered.  Document Released: 06/27/2000 Document Revised: 06/13/2011 Document Reviewed: 02/16/2007  ExitCare?? Patient Information ??2014 [place], [place].    Anxiety and Panic Attacks  Anxiety is your body's way of reacting to real danger or something you think is a danger. It may be fear or worry over a situation like losing your job. Sometimes the cause is not known. A panic attack is made up of physical signs like sweating, shaking, or chest pain. Anxiety and panic attacks may start suddenly. They may be strong. They may come at any time of day, even while sleeping. They may come at any time of life. Panic attacks are scary, but they do not harm you physically.   HOME CARE  ?? Avoid any known causes of your anxiety.  ?? Try to relax. Yoga may help. Tell yourself everything will be okay.  ?? Exercise often.  ?? Get expert advice and help (therapy) to stop anxiety or attacks from happening.  ?? Avoid caffeine, alcohol, and drugs.  ?? Only take medicine as told by your doctor.  GET HELP RIGHT AWAY IF:  ?? Your attacks seem different than normal attacks.  ?? Your problems are getting worse or concern you.  MAKE SURE YOU:  ?? Understand these instructions.  ?? Will watch your condition.  ?? Will get help right away if you are not doing well or get worse.  Document Released: 04/23/2010 Document Revised: 06/13/2011 Document Reviewed: 04/23/2010  ExitCare?? Patient Information ??2014 [place], [place].

## 2013-08-29 ENCOUNTER — Emergency Department: Admit: 2013-08-29 | Payer: PRIVATE HEALTH INSURANCE

## 2013-08-29 ENCOUNTER — Inpatient Hospital Stay
Admit: 2013-08-29 | Discharge: 2013-08-29 | Disposition: A | Discharge status: Home / Self Care | Payer: PRIVATE HEALTH INSURANCE

## 2013-08-29 DIAGNOSIS — S93409A Sprain of unspecified ligament of unspecified ankle, initial encounter: Secondary | ICD-10-CM

## 2013-08-29 MED ORDER — HYDROcodone-acetaminophen (NORCO) 5-325 mg per tablet 2 tablet
5-325 | Freq: Once | ORAL | Status: AC
Start: 2013-08-29 — End: 2013-08-29

## 2013-08-29 MED ORDER — naproxen (NAPROSYN) 500 MG tablet
500 | ORAL_TABLET | Freq: Two times a day (BID) | ORAL | Status: AC | PRN
Start: 2013-08-29 — End: 2013-12-26

## 2013-08-29 MED FILL — HYDROCODONE 5 MG-ACETAMINOPHEN 325 MG TABLET: 5-325 5-325 mg | ORAL | Qty: 2

## 2013-08-29 NOTE — Unmapped (Signed)
[facility] ED Note    Reason for Visit: Ankle Injury      Patient History     HPI: Jocelyn Schaefer is a 18 y.o. female who presents to the Emergency Department with the complaint of left ankle injury that occurred last evening.  The patient states that her left foot was asleep and she went to stand up last evening and accidentally inverted her left ankle.  She states that she had immediate pain and swelling to the left ankle but did not seek initial treatment as her mother thought that the pain and swelling may improve.  She reports that this morning when the pain was persistent and the swelling had increased, she decided to come to the emergency room for evaluation.  She states that she is able to wiggle her toes without difficulty.  She has extreme pain with weightbearing.  She denies any knee or hip pain.  She denies any tingling or numbness in her toes.  She denies any other injury sustained.  She has not injured this ankle before.  She denies any other injuries sustained at the time of this event.  She denies any abrasion, laceration, or open wound.  She has not tried anything for symptomatic relief.  She has no additional complaints or concerns at this time.     Past Medical History   Diagnosis Date   ??? Depression    ??? Anxiety    ??? Bipolar disorder    ??? Asthma        Past Surgical History   Procedure Laterality Date   ??? Tonsillectomy     ??? Tympanostomy tube placement         Marcy Sookdeo  reports that she has been smoking.  She has never used smokeless tobacco. She reports that she does not drink alcohol or use illicit drugs.    Previous Medications    BUPROPION HCL (WELLBUTRIN ORAL)    Take by mouth at bedtime.    ESCITALOPRAM OXALATE (LEXAPRO) 5 MG TABLET    Take 10 mg by mouth daily. Unsure of dosage      LAMOTRIGINE (LAMICTAL) 100 MG TABLET    Take 100 mg by mouth 2 times a day.       Allergies:   Allergies as of 08/29/2013   ??? (No Known Allergies)              Review of Systems     ROS: Please reference history of present illness.  All other systems were reviewed and negative.    Physical Exam     ED Triage Vitals   Vital Signs Group      Temp 08/29/13 0911 98.5 ??F (36.9 ??C)      Temp Source 08/29/13 0911 Oral      Heart Rate 08/29/13 0911 100      Heart Rate Source 08/29/13 0911 Monitor      Resp 08/29/13 0911 16      SpO2 08/29/13 0911 99 %      BP 08/29/13 0911 133/81 mmHg      BP Location 08/29/13 0911 Right arm      BP Method 08/29/13 0911 Automatic      Patient Position 08/29/13 0911 Sitting   SpO2 08/29/13 0911 99 %   O2 Device 08/29/13 0911 None (Room air)       General: Jocelyn Schaefer is a well-nourished female who is non-toxic in appearance and not in any acute distress.  she is awake, alert  and oriented.    Extremities: There is tenderness and swelling to the left lateral malleolus.  No tenderness to palpation at the base of the 5th metatarsal.  No proximal tibia or fibular tenderness.  No tibial blood tenderness.  The knee is without tenderness and with full range of motion.  She is able to wiggle her toes without difficulty.  No ecchymosis, abrasion, laceration to the left foot or ankle.  No foot tenderness to palpation. 2+ dorsalis pedis pulses present.  Capillary refills less than 2 seconds.  The remainder of the extremity exam is unremarkable.      Skin: The skin is intact, warm and dry.  There is no rash.  There is mild ankle edema over the left lateral malleolus.    Neuro: Sensation is grossly intact.      Psych: Behavior and speech are appropriate.      Diagnostic Studies     Labs:    No tests were performed during this ED visit     Radiology:    Please see electronic medical record for any tests performed in the ED    EKG:    No EKG Performed    Emergency Department Procedures     None    ED Course and Medical-Decision-Making     Jocelyn Schaefer was admitted to B Pod Room 14 in the Emergency Department for evaluation of her chief complaint as  described in the history of present illness.  Complete history and physical was performed by myself as well as by the attending physician Williemae Natter, MD who agrees with my assessment, treatment and plan.    Jocelyn Schaefer's presenting symptoms and associated physical exam findings are most consistent with left ankle pain and swelling after an inversion injury that occurred yesterday.  The patient is neurovascular intact.  Ice was applied to her ankle.  X-rays of the left ankle and foot were obtained which revealed no acute fracture or subluxation.      The patient at this time was placed in an Aircast and given a set of crutches and instructions to weight-bear as tolerated.  She was advised to continue with ice and elevation at home.  She was given a prescription for naproxen with instructions for use.  She was advised to followup with orthopedic surgery for any persistent symptoms or concerns after 7-10 days, as she may require further evaluation.  She is advised to return to emergency room for any new injury, worsening pain, inability to weight-bear range of motion her foot or ankle, or any other worsening symptoms or concerns.    The patient voices understanding of the plan and instructions. All of her questions were answered prior to discharge home.    Critical Care Time (Attendings)     N/A    Impression     1. Left ankle sprain       Plan     1) The patient is discharged to home in stable condition.  2) As stated above.  3) The patient is instructed to return to the emergency department should her symptoms worsen or any concern she believes warrants acute physician evaluation.                  [place], [place]  08/29/13 1154

## 2013-08-29 NOTE — Unmapped (Signed)
ED Attending Attestation Note    Date of service:  08/29/2013    This patient was seen by the mid-level provider.  I have seen and examined the patient, agree with the workup, evaluation, management and diagnosis.  The care plan has been discussed and I concur.      My assessment reveals a 18 y.o. female with a history of bipolar disorder presenting today with a chief complaint of left ankle pain after standing up and twisting her ankle while her foot was asleep.  Here, she appears well.  She has lateral malleoli or tenderness and soft tissue swelling with mild ecchymosis.  No foot tenderness.  Left foot neurovascularly intact.  X-rays negative for fracture.  We'll treat his sprain with air cast, crutches for comfort.

## 2013-08-29 NOTE — Unmapped (Signed)
Use ice, elevation, and rest for relief of ankle pain and swelling.  Use the Aircast for comfort as needed for the next 2-3 days.  Use the crutches to weight-bear as tolerated.  Take the naproxen as needed for pain and inflammation.    Followup with the orthopedic surgeons for ongoing symptoms or concerns after 7-10 days, as you may require further evaluation.    Return to emergency room for any progressively worsening pain, new injury, tingling or numbness in your toes, or other worsening symptoms or concerns.

## 2013-08-29 NOTE — Unmapped (Signed)
Ice pack to left ankle.

## 2013-08-29 NOTE — Unmapped (Addendum)
Pt presents after left ankle injury last evening. Pt states her foot was numb and she went to stand up and twisted ankle.

## 2013-08-29 NOTE — Unmapped (Signed)
[facility] ED Culture/Lab follow-up  Final Result - Nothing to do.    urine culture: Mixed Skin/Urogenital Flora.

## 2013-08-29 NOTE — Unmapped (Signed)
Family at bedside.

## 2013-12-21 DIAGNOSIS — R45851 Suicidal ideations: Secondary | ICD-10-CM

## 2013-12-21 NOTE — Unmapped (Signed)
Sitter at bedside

## 2013-12-21 NOTE — Unmapped (Signed)
Pt here with friend. States increased thoughts of suicide without a plan at this time. Pt has been superficial cutting and burning self with cigarettes.

## 2013-12-21 NOTE — Unmapped (Signed)
Sitter remains at bedside

## 2013-12-21 NOTE — Unmapped (Signed)
Patient is sitting in bed with suicide precautions in place.  Sitter at bedside.

## 2013-12-21 NOTE — Unmapped (Signed)
Patient again encouraged to provide urine specimen; Patient given water

## 2013-12-21 NOTE — Unmapped (Signed)
[place] ED Note    Date of Service 12/21/2013    Reason for Visit: Suicidal      Patient History     HPI: Jocelyn Schaefer is a 18 y.o. female who presents with suicidal thoughts.  The patient reports increased thoughts of hurting herself for the past month or so due to increased stress with a new job.  She has been cutting herself on her legs and has not taken any specific ingestions.  She has no specific plan on how she hurt herself.  She is wanted to come in for the past several days for treatment and finally got a friend bring her in tonight.  Has a history of bipolar, borderline and feelings of suicidal thoughts on a frequent basis.  She has been a wrist cutter when she was younger but not recently.  She thinks the cutting is an increase in her stress and feeling suicidal that she is also concerned about.    Family History       Past Medical History   Diagnosis Date   ??? Depression    ??? Anxiety    ??? Bipolar disorder    ??? Asthma        Past Surgical History   Procedure Laterality Date   ??? Tonsillectomy     ??? Tympanostomy tube placement          reports that she has been smoking.  She uses smokeless tobacco. She reports that she drinks alcohol. She reports that she uses illicit drugs.    Previous Medications    BUPROPION HCL (WELLBUTRIN ORAL)    Take by mouth at bedtime.    ESCITALOPRAM OXALATE (LEXAPRO) 5 MG TABLET    Take 10 mg by mouth daily. Unsure of dosage      LAMOTRIGINE (LAMICTAL) 100 MG TABLET    Take 100 mg by mouth 2 times a day.    NAPROXEN (NAPROSYN) 500 MG TABLET    Take 1 tablet (500 mg total) by mouth 2 times a day as needed (pain). Take with food.       Allergies:   Allergies as of 12/21/2013   ??? (No Known Allergies)       Review of Systems     ROS: No fevers or chills.  No chest pain or shortness of breath.  No abdominal pain.  No falls or injuries.  All other ROS were negative.    Physical Exam     ED Triage Vitals   Vital Signs Group      Temp  12/21/13 2135 98 ??F (36.7 ??C)      Temp Source 12/21/13 2135 Oral      Heart Rate 12/21/13 2135 95      Heart Rate Source 12/21/13 2135 Monitor      Resp 12/21/13 2135 16      SpO2 12/21/13 2135 96 %      BP 12/21/13 2135 136/79 mmHg      BP Location 12/21/13 2135 Right arm      BP Method 12/21/13 2135 Automatic      Patient Position 12/21/13 2135 Sitting   SpO2 12/21/13 2135 96 %   O2 Device 12/21/13 2135 None (Room air)     Filed Vitals:    12/21/13 2135 12/21/13 2255 12/22/13 0014   BP: 136/79 121/70 141/71   Pulse: 95 82 86   Temp: 98 ??F (36.7 ??C)     TempSrc: Oral     Resp: 16 16 16  SpO2: 96% 99% 99%        Constitutional:  Well developed, well nourished, no acute distress, non-toxic appearance   Eyes:  Sclera anicteric, conjunctiva normal pupils are equal round reactive to light, extraocular muscle intact  HENT:  Atraumatic, external ears normal, nose normal, oropharynx moist.   Neck- normal range of motion, supple   Respiratory:  No respiratory distress, normal breath sounds, Good air movement   Cardiovascular:  Regular rate, no murmurs, no gallops, no rubs   GI:  Soft, nondistended, nontender, no rebound, no guarding bowel sounds are present  Musculoskeletal:  No edema, no deformities.  Left proximal thigh has superficial cuts that are scabbed at this time and do not require any suturing.  Skin:  No rash or nodules noted.   Neurologic:  Awake and alert.  Moves all four extremities.  Light touch intact.    Psychiatric: Somewhat flat affect but appropriate behavior with good insight      Diagnostic Studies     Labs:    Labs Reviewed   URINALYSIS W/ REFLEX TO MICROSCOPIC - Abnormal; Notable for the following:     Clarity, UA Slightly-Cloudy (*)     Urobilinogen, UA 2.0 (*)     Leukocytes, UA Large (*)     RBC, UA 4 (*)     WBC, UA 11 (*)     Squam Epithel, UA 11 (*)     Bacteria, UA Occasional (*)     Mucus, UA Present (*)     All other components within normal limits   URINE DRUG SCREEN WITHOUT CONFIRMATION,  STAT - Abnormal; Notable for the following:     THC UR, 50 ng/mL Cutoff Presumptive Positive (*)     All other components within normal limits   BASIC METABOLIC PANEL   CBC   DIFFERENTIAL   ETHANOL, SERUM   HCG URINE, QUALITATIVE         Emergency Department Procedures         ED Course and MDM   Medications - No data to display    Wt Readings from Last 3 Encounters:   08/29/13 230 lb (104.327 kg)   08/26/13 230 lb (104.327 kg)   05/06/13 221 lb (100.245 kg) (99%*, Z = 2.22)     * Growth percentiles are based on CDC 2-20 Years data.     Temp Readings from Last 3 Encounters:   12/21/13 98 ??F (36.7 ??C) Oral   08/29/13 98.5 ??F (36.9 ??C) Oral   08/26/13 97.6 ??F (36.4 ??C) Oral     BP Readings from Last 3 Encounters:   12/22/13 141/71   08/29/13 133/81   08/26/13 125/70     Pulse Readings from Last 3 Encounters:   12/22/13 86   08/29/13 85   08/26/13 74       Jocelyn Schaefer is a 18 y.o. female who presented to the emergency department with Suicidal      The patient has suicidal thoughts that will require a central??.  Her liver studies show a contaminated urine, tox screen negative except for THC and otherwise normal alcohol CBC and electrolytes.  The patient will recur placement for evaluation of her suicidal thoughts a psychiatric facility.  sHe is unable to contract for safety does not feel safe in her home environment this time being alone.    Clinical Impression    Suicidal ideation    Self-inflicted left thigh lacerations      Critical Care Time (Attendings)  Martie Round, MD  12/22/13 [phone]

## 2013-12-21 NOTE — Unmapped (Signed)
Friend remains at bedside

## 2013-12-21 NOTE — Unmapped (Signed)
Registration at bedside

## 2013-12-21 NOTE — Unmapped (Signed)
Patient encouraged to provide urine specimen; Patient states she is unable to at this time; States she will let us know when she is able

## 2013-12-22 ENCOUNTER — Inpatient Hospital Stay
Admit: 2013-12-22 | Discharge: 2013-12-27 | Disposition: A | Discharge status: Home / Self Care | Payer: PRIVATE HEALTH INSURANCE | Source: Other Acute Inpatient Hospital

## 2013-12-22 ENCOUNTER — Inpatient Hospital Stay
Admit: 2013-12-22 | Discharge: 2013-12-22 | Disposition: A | Discharge status: Other Acute Inpatient Hospital | Payer: PRIVATE HEALTH INSURANCE

## 2013-12-22 DIAGNOSIS — F3163 Bipolar disorder, current episode mixed, severe, without psychotic features: Secondary | ICD-10-CM

## 2013-12-22 LAB — URINALYSIS W/RFL TO MICROSCOPIC
Bilirubin, UA: NEGATIVE
Blood, UA: NEGATIVE
Glucose, UA: NEGATIVE mg/dL
Ketones, UA: NEGATIVE mg/dL
Nitrite, UA: NEGATIVE
Protein, UA: NEGATIVE mg/dL
RBC, UA: 4 /HPF (ref 0–3)
Specific Gravity, UA: 1.025 (ref 1.005–1.035)
Squam Epithel, UA: 11 /HPF (ref 0–5)
Urobilinogen, UA: 2 mg/dL (ref 0.2–1.9)
WBC, UA: 11 /HPF (ref 0–5)
pH, UA: 6 (ref 5.0–8.0)

## 2013-12-22 LAB — BASIC METABOLIC PANEL
Anion Gap: 8 mmol/L (ref 3–16)
BUN: 10 mg/dL (ref 7–25)
CO2: 23 mmol/L (ref 21–33)
Calcium: 9 mg/dL (ref 8.6–10.3)
Chloride: 106 mmol/L (ref 98–110)
Creatinine: 0.68 mg/dL (ref 0.60–1.30)
GFR MDRD Af Amer: 133 See note.
GFR MDRD Non Af Amer: 110 See note.
Glucose: 100 mg/dL (ref 70–100)
Osmolality, Calculated: 283 mOsm/kg (ref 278–305)
Potassium: 3.7 mmol/L (ref 3.5–5.3)
Sodium: 137 mmol/L (ref 133–146)

## 2013-12-22 LAB — CBC
Hematocrit: 40.8 % (ref 35.0–45.0)
Hemoglobin: 13.3 g/dL (ref 11.7–15.5)
MCH: 27.4 pg (ref 27.0–33.0)
MCHC: 32.6 g/dL (ref 32.0–36.0)
MCV: 83.9 fL (ref 80.0–100.0)
MPV: 9.5 fL (ref 7.5–11.5)
Platelets: 253 10*3/uL (ref 140–400)
RBC: 4.87 10*6/uL (ref 3.80–5.10)
RDW: 15 % (ref 11.0–15.0)
WBC: 9.7 10*3/uL (ref 3.8–10.8)

## 2013-12-22 LAB — DIFFERENTIAL
Basophils Absolute: 58 /uL (ref 0–200)
Basophils Relative: 0.6 % (ref 0.0–1.0)
Eosinophils Absolute: 310 /uL (ref 15–500)
Eosinophils Relative: 3.2 % (ref 0.0–8.0)
Lymphocytes Absolute: 2842 /uL (ref 850–3900)
Lymphocytes Relative: 29.3 % (ref 15.0–45.0)
Monocytes Absolute: 621 /uL (ref 200–950)
Monocytes Relative: 6.4 % (ref 0.0–12.0)
Neutrophils Absolute: 5869 /uL (ref 1500–7800)
Neutrophils Relative: 60.5 % (ref 40.0–80.0)

## 2013-12-22 LAB — HCG URINE, QUALITATIVE: Preg Test, Ur: NEGATIVE

## 2013-12-22 LAB — URINE CULTURE: Culture Result: >100000

## 2013-12-22 LAB — URINE DRUG SCREEN WITHOUT CONFIRMATION, STAT
Amphetamine, 500 ng/mL Cutoff: NEGATIVE
Barbiturates UR, 300  ng/mL Cutoff: NEGATIVE
Benzodiazepines UR, 300 ng/mL Cutoff: NEGATIVE
Buprenorphine, 5 ng/mL Cutoff: NEGATIVE
Cocaine UR, 300 ng/mL Cutoff: NEGATIVE
MDMA, 500 ng/mL Cutoff: NEGATIVE
Methadone, UR, 300 ng/mL Cutoff: NEGATIVE
Opiates UR, 300 ng/mL Cutoff: NEGATIVE
Oxycodone, 100 ng/mL Cutoff: NEGATIVE
THC UR, 50 ng/mL Cutoff: POSITIVE — AB
Tricyclic Antidepressants, 300 ng/mL Cutoff: NEGATIVE

## 2013-12-22 LAB — ETHANOL, SERUM: Ethanol: <10 mg/dL (ref 0–10)

## 2013-12-22 MED ORDER — sulfamethoxazole-trimethoprim (BACTRIM DS) 800-160 mg per tablet 1 tablet
800-160 | Freq: Two times a day (BID) | ORAL | Status: AC
Start: 2013-12-22 — End: 2013-12-25
  Administered 2013-12-23 – 2013-12-25 (×6): 1 via ORAL

## 2013-12-22 MED ORDER — OLANZapine (ZYPREXA) injection 5 mg
10 | INTRAMUSCULAR | Status: AC | PRN
Start: 2013-12-22 — End: 2013-12-27

## 2013-12-22 MED ORDER — LORazepam (ATIVAN) tablet 0.5 mg
0.5 | Freq: Three times a day (TID) | ORAL | Status: AC | PRN
Start: 2013-12-22 — End: 2013-12-27

## 2013-12-22 MED ORDER — olanzapine zydis (ZYPREXA) disintegrating tablet 5 mg
5 | Freq: Four times a day (QID) | ORAL | Status: AC | PRN
Start: 2013-12-22 — End: 2013-12-23

## 2013-12-22 MED FILL — SULFAMETHOXAZOLE 800 MG-TRIMETHOPRIM 160 MG TABLET: 800-160 800-160 mg | ORAL | Qty: 1

## 2013-12-22 NOTE — Unmapped (Signed)
Per Karoline Caldwell, RN at [facility] ER: pt came to ER with increased SI, increased self injuring (superficial cutting/burning), no plan. Pt described as cooperative but despairing, poor hygiene, ate 50% of her breakfast and lunch. Pt is 5'8, 230 lbs. BMP wnl, UA with leukocytes, U-tox +THC, smoker, not pregnant. On hold, eta for transport is 13:30. VSS: 122/67, 89, 16 (100% on RA), afebrile.

## 2013-12-22 NOTE — Unmapped (Signed)
Refer to EPIC downtime paper charting.

## 2013-12-22 NOTE — Unmapped (Signed)
[facility]  Psychiatric Social Worker Assessment Consult Note      Jocelyn Schaefer    [id number]         Clinician's description of presenting problem: Patient is a 18 year old Caucasian female who presents today at [facility] ED after having increased thoughts of suicide and reportedly increased self harm attempts over the past 1.5 months. She stated she has been pushing back a breakdown for a long time. She recently started a new job and has been without her medication since May due to cost. Patient has been diagnosed with bipolar disorder and borderline personality disorder per her report.     History:  History of Present Illness: Patient reported she has been at [facility]) psychiatric department 2 times in the past. Two years ago was her first time at [facility] for depersonalizing because of school. My mom thought I should go in so I did. The other time was approximately 1 year ago for having thoughts of harming herself. Patient reported in the past she will get urges to set things on fire and stab myself in the neck and thigh. She reported she hasn't had this urge in a while to stab herself but has been cutting her thighs and burnt her arm the other day.     Psychiatric History: Patient reported she has been seen at [facility] in the past for reported Bipolar disorder and Borderline Personality Disorder.     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 She reportedly drinks alcohol on occasion and smokes tobacco daily.     Social History, Support System and Current Living Situation: Patient reported she moved out from her mother's home in May and has been living in an apartment with a friend. She reported It's been tense lately. I lie to get what I want. I'm very manipulative. Patient reported not feeling like she has anyone to talk too stating I don't like to talk to many people about how I'm feeling. I  get really upset if someone is listening to me. I then go straight to suicidal thoughts. Patient reported her parents divorced at an early age and I never learned how to process my feelings.     Collateral Information:  None    Mental Status Exam:     Appearance and Behavior  Patient Behaviors: Cooperative  Patient appeared in medical gown, was cooperative, casual eye contact, flat affect and logical and linear thought process.         Affect / Thought  Perception: Appropriate      Risk Factors/Stress Factors:     patient expressed feeling a lack of support and having increased stress from her new job.      Telepsychiatry Considerations:   N/A    Formulation and Plan:   Patient understands she is on 72 hour hold and agreed to be transported to psychiatric facility.      Patient notified of plan: Yes.  Patient reaction to plan: Agreeable and calm   Transportation Agent notified of safety needs: yes

## 2013-12-22 NOTE — Unmapped (Signed)
Sitter remains at bedside

## 2013-12-22 NOTE — Unmapped (Signed)
[facility] [place]     Internist Admission History and Physical    Name: Jocelyn Schaefer  DOB: 04/18/1995 MRN: [id number]    Admit Date: 12/22/2013    Admitting Physician: Dalene Carrow, MD   Patient's PCP: No Pcp    Chief Cmplaint: I have been pushing back a breakdown for a long time.    History of Present Illness:    This patient is a pleasant 18 y.o. female who has been admitted to the [facility] who presents today at [facility] ED after having increased thoughts of suicide and reportedly increased self harm attempts over the past 1.5 months. She recently started a new job and has been without her medication since May due to cost. Patient has been diagnosed with bipolar disorder and borderline personality disorder per her report in the past.     Allergies:  No Known Allergies    Past Medical History:  Past Medical History   Diagnosis Date   ??? Depression    ??? Anxiety    ??? Bipolar disorder    ??? Asthma    ??? Borderline personality disorder        Past Surgical History:  Past Surgical History   Procedure Laterality Date   ??? Tonsillectomy  2001   ??? Tympanostomy tube placement  2001       Medications:   Current Facility-Administered Medications   Medication Dose Frequency Provider Last Dose   ??? LORazepam  0.5 mg TID PRN Chrys Racer, MD     ??? OLANZapine  5 mg UD PRN Chrys Racer, MD     ??? olanzapine zydis  5 mg Q6H PRN Chrys Racer, MD         Family Medical History:  Family History   Problem Relation Age of Onset   ??? Bipolar disorder Maternal Aunt        Social History:  History     Social History   ??? Marital Status: Single     Spouse Name: N/A     Number of Children: N/A   ??? Years of Education: N/A     Social History Main Topics   ??? Smoking status: Current Some Day Smoker   ??? Smokeless tobacco: Never Used   ??? Alcohol Use: Yes      Comment: last use two weeks ago- unsure of amt.   ??? Drug Use: Yes     Special: Marijuana      Comment: UA tox screen positive for Marijuana.- last use couple days  ago   ??? Sexual Activity: Not Currently     Partners: Male     Birth Control/ Protection: None     Other Topics Concern   ??? None     Social History Narrative   ??? None       Review of Systems:  Denies any fever, chills, blurry vision, chest pain, shortness of breath, nausea, vomiting, abdominal pain urinary symptoms or bowel movement changes.    Physical Exam: Vital signs reviewed if available     Filed Vitals:    12/22/13 1428   BP: 129/78   Pulse: 92   Temp: 97.9 ??F (36.6 ??C)   SpO2: 100%       General Appearance:    Alert, cooperative, no distress, appears stated age   Head:    Normocephalic, without obvious abnormality, atraumatic   Eyes:    PERRL, conjunctiva/corneas clear, EOM's intact, both eyes   Throat:   Lips,  mucosa, and tongue normal; teeth and gums normal   Neck:   Supple, symmetrical, trachea midline, no adenopathy;     thyroid:  no enlargement/tenderness/nodules; no carotid    bruit or JVD   Back:     Symmetric, no curvature, ROM normal, no CVA tenderness   Lungs:     Clear to auscultation bilaterally, respirations unlabored    Heart:    Regular rate and rhythm, S1 and S2 normal    Abdomen:     Soft, non-tender, bowel sounds active, no palpable masses   Extremities:   No peripheral edema   Pulses:   Neurologic  Skin:   2+ and symmetric all extremities    No focal motor or sensory deficits    No visible lacerations or scars         Laboratory Studies: Reviewed, if performed and available     Recent Labs      12/21/13   2220   WBC  9.7   HGB  13.3   HCT  40.8   PLT  253      Recent Labs      12/21/13   2220   NA  137   K  3.7   CL  106   CO2  23   BUN  10   CREATININE  0.68   GLUCOSE  100     Recent Labs      12/22/13   0015   NITRITE  Negative   COLORU  Yellow   PHUR  6.0   WBCUA  11*   RBCUA  4*   MUCUS  Present*   BACTERIA  Occasional*   CLARITYU  Slightly-Cloudy*   LEUKOCYTESUR  Large*   UROBILINOGEN  2.0*   BILIRUBINUR  Negative   BLOODU  Negative   GLUCOSEU  Negative   KETONESU  Negative         Assessment / Plan:            This patient is a pleasant 18 y.o. female who has been admitted to the [facility] for further evaluation and management of this patient's mental illness.    1. Bipolar Disorder/Borderline Personality Disorder - Treatment and Medications as per Psychiatry.  2. Urinary Tract Infection - Rest, drink plenty of fluids and take antibiotic as prescribed.  3. Asthma - mild intermittent - Albuterol MDI prn.  4. Cannabis Use - Encouraged cessation.  5. Tobacco Abuse - Encourage cessation.    Total time spent on the admission of this patient was greater than 35 minutes, including time spent interviewing and  examining the patient, reviewing the medical data, and completing the electronic medical record.    Electronically signed:  Dalene Carrow, MD  12/22/2013  [facility] [place]

## 2013-12-22 NOTE — Unmapped (Addendum)
Per [facility], patient is a 18 yo female with suicidal thoughts, increased thoughts of hurting herself for the past month r/t increased stress in a new job situation. Hx: cutting behaviors. Hx: Bipolar, BPD. Reports stopped taking medications since May due to the cost. Patient reports living in an apt. with a friend, moved out from mother's home last May. K.Cates, RN

## 2013-12-22 NOTE — Unmapped (Signed)
Urine cx sent

## 2013-12-22 NOTE — Unmapped (Signed)
Patient was accepted to [facility] by admitting Dr. Elizabeth Tiffany. SW to set up transportation.

## 2013-12-22 NOTE — Unmapped (Signed)
Patient updated on plan of care and informed of any delays.  Call light is in reach and bed rails up for safety.  No concerns voiced at this time. The patient was advised to use call light if any concerns or questions.

## 2013-12-22 NOTE — Unmapped (Signed)
SW met with patient, conducted assessment and will begin finding placement.     Anastasia Fiedler MSW, LISW

## 2013-12-22 NOTE — Unmapped (Signed)
Patient is 18 year old female who is depressed and having self harm thoughts. Denies ever attempting SI but. Is concerned she might attempt this due to all my stresses.She complains of stress @ work/owing her room mate $700/finances in horrible shape She admits to cutting and burning herself a few days ago but, It helps sometimes to ease the pressure and pain.She states she hopes to learn new coping skills and get some assistance with her finances. Patient states she will come to staff if thoughts of self harm should occur ( I feel safe here) Patient encouraged to bath but, refused @ this time. SRA is 12. 15 minute checks maintained per staff per unit protocol.

## 2013-12-23 MED ORDER — lithium carbonate capsule 300 mg
300 | Freq: Two times a day (BID) | ORAL | Status: AC
Start: 2013-12-23 — End: 2013-12-27
  Administered 2013-12-23 – 2013-12-27 (×8): 300 mg via ORAL

## 2013-12-23 MED ORDER — risperiDONE (RISPERDAL) tablet 1 mg
1 | Freq: Every evening | ORAL | Status: AC
Start: 2013-12-23 — End: 2013-12-24
  Administered 2013-12-24: 01:00:00 1 mg via ORAL

## 2013-12-23 MED ORDER — risperiDONE M-TAB (RISPERDAL M-TABS) disintegrating tablet 0.5 mg
0.5 | Freq: Three times a day (TID) | ORAL | Status: AC | PRN
Start: 2013-12-23 — End: 2013-12-27

## 2013-12-23 MED FILL — RISPERIDONE 1 MG TABLET: 1 1 MG | ORAL | Qty: 1

## 2013-12-23 MED FILL — LITHIUM CARBONATE 300 MG CAPSULE: 300 300 MG | ORAL | Qty: 1

## 2013-12-23 MED FILL — SULFAMETHOXAZOLE 800 MG-TRIMETHOPRIM 160 MG TABLET: 800-160 800-160 mg | ORAL | Qty: 1

## 2013-12-23 NOTE — Unmapped (Signed)
Pt slept 5 3/4 hours this shift. Q 15 minute checks maintained throughout shift per unit protocol.

## 2013-12-23 NOTE — Unmapped (Signed)
Problem: Coping/Self Expression Impairment  Goal: Increase coping skills  Intervention: Provide leisure coping skills groups  Provide daily RT groups to encourage participation in appropriate leisure coping skills.    Responsible staff: Contina Strain Delma Post, CTRS  Group Note    Patient Name: Jocelyn Schaefer    Date: 12/23/2013       Time: 11:21 AM                Group Type: Recreation Therapy    Group Name: Peaceful Place    Group Objective: Purpose of this group is to enable participants to feel comfortable in using and expressing their imagination through drawing what they felt is a peaceful and calm place for them.  Participants were encouraged to provide as much detail as possible in the picture.       Attendance: Attended    Interactions: Interacted appropriately    Mood/Affect: Appropriate and Calm    Patient Participation: Pt was engaged in group session. Pt was appropriately social with peers and staff. Pt shared past leisure interests during group discussion. Pt completed activity.     Jinx Gilden C Abrie Egloff, CTRS

## 2013-12-23 NOTE — Unmapped (Signed)
Problem: Leisure Resources Needs  Goal: Demonstrate understanding of leisure resources  Intervention: Provide opportunities for healthy leisure interests  Provide daily opportunities during RT groups to develop healthy leisure interests.      Responsible staff: DESMOND Delma Post, CTRS  Name: Jocelyn Schaefer    Date: 12/23/2013    Time: 4:18 PM    Group Type: Recreation Therapy    Group Name: Scattegories    Group Objective: Purpose of this group was to learn a new leisure pursuit, engage in socialization opportunities and to allow for quick and creative thinking during group.  Participants were asked to identify words starting with the same letter from a list of subjects.      Attendance: Did not attend    Interaction: Did not interact    Mood/Affect: Unable to assess    Participation: Pt declined to participate in activity.       Maaz Spiering [place], CTRS
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
## 2013-12-23 NOTE — Unmapped (Signed)
THERAPEUTIC RECREATION ASSESSMENT    Name: Jocelyn Schaefer  DOB: Feb 05, 1996  Attending Physician: Janifer Adie, MD  Admission Diagnosis: suicidal thoughts  self injuring behaviors  Date: 12/23/2013       Stated Goals  TR Goal #1: Not to self harm, get meds that wrok  TR Goal #2: attend group for borderline personality      Prior Function  Prior Function  Level of Independence: Independent  Lives With: Friend(s)  Receives Help From: Friend(s)  ADL Assistance: Independent  Homemaking/IADL Assistance: Independent  Vocational: Full time employment      Vision  Current Vision: Wears glasses all the time    Hearing  Hearing: No Deficits    Cognition  Overall Cognitive Status: Within Functional Limits  Orientation Level: Oriented X4    Social Domain  Overall Emotional Status: Impaired  Emotional Barriers: Anxious;Critical Self;Self esteem    Community Domain  Overall Community Domain Status: Within Functional Limits    Leisure Domain  Overall Leisure Domain Status: Impaired  Leisure Barriers: Withdrawn from positive leisure activities  Leisure Interest: computers;craft/arts (netflix)    Pt admitted for self harm and SI. Pt reports many stressors with her roommates and not taking meds due to finances. Pt lives with 2 roommates and the one is moving out, not paying rent, but not giving up her keys. Pt works full time evening shift. She reports family is somewhat supportive and live nearby. Pt has many stressors including finances, poor coping skills, anxiety around meeting new people, and feeling the need for constant attention. Pt also reports low self esteem, feeling easily discouraged by any slight change on her body such as a bump or red mark. Pt reports isolating more and spending little time engaged in leisure. Pt would benefit from RT to learn and engaging in healthy coping skills.
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
## 2013-12-23 NOTE — Unmapped (Signed)
Jocelyn Schaefer was blunted and depressed. Her SRT was 12, she admitted to thoughts of self-harm but no plan. She was compliant medications. She attended groups and participated. She attended to her ADLs and ate both breakfast and lunch. She was polite and cooperative with care.

## 2013-12-23 NOTE — Unmapped (Signed)
Problem: Disposition  Intervention: Child psychotherapist will provide group therapy  that encourages increased coping skills, insight building and discharge preparedness.    Responsible staff: Leianne Callins Ladoris Gene, MSW, LSW   Group Note    Patient Name: Jocelyn Schaefer    Date: 12/23/2013       Time: 1:00 PM                Group Type: Social Work    Group Name: Positive Psychology    Group Objective:Learn about Positive Psychology techniques and identify personal strengths and how they can be used to improve personal well-being.     Attendance: Did not attend    Interactions: Did not interact    Mood/Affect: Unable to assess    Patient Participation: Pt did not attend group.    Jocelyn Schaefer, MSW, LSW

## 2013-12-23 NOTE — Unmapped (Signed)
Patient was depressed, anxious and flat this shift. She stated, despairing would be a good descriptive word. Patient ate 100% of dinner and was medication compliant. PRN medication was not given. Patient did not attend unit programming this evening. Patient rated day 6 on 0-10 scale. Rated anxiety 8 on 0-10 scale stating, this is probably somewhat related to the new medications I am taking. I am scared of side effects and someone told me you throw up a lot. RN reminded patient that each person is unique and reacts differently to medications. Encouraged her to utilize coping skills when dealing with anxious thoughts. Rated depression 7 on 0-10 scale. Pt admitted to thoughts of SI and self harm but that she had no plan. Pt verbally contracted for safety with this RN. Denies homicidal ideation.  Denies A/V hallucinations. SRT 11 indicating mild risk.  No risky behaviors or medical concerns reported or observed this shift. Continued safety checks by staff every 15 minutes.

## 2013-12-24 MED ORDER — risperiDONE (RISPERDAL) tablet 2 mg
2 | Freq: Every evening | ORAL | Status: AC
Start: 2013-12-24 — End: 2013-12-27
  Administered 2013-12-25 – 2013-12-27 (×3): 2 mg via ORAL

## 2013-12-24 MED FILL — SULFAMETHOXAZOLE 800 MG-TRIMETHOPRIM 160 MG TABLET: 800-160 800-160 mg | ORAL | Qty: 1

## 2013-12-24 MED FILL — RISPERIDONE 2 MG TABLET: 2 2 MG | ORAL | Qty: 1

## 2013-12-24 MED FILL — LITHIUM CARBONATE 300 MG CAPSULE: 300 300 MG | ORAL | Qty: 1

## 2013-12-24 NOTE — Unmapped (Signed)
Problem: Disposition  Intervention: Disposition Intervention  Social worker will assist the patient in planning their discharge as evidenced by discussing relapse prevention including treatment recommendations, providing referrals and assisting in securing follow up appointments.    Responsible staff: Adetokunbo Mccadden Ladoris Gene, MSW, LSW   SW met with pt to review the consent to treatment form per Dr. Richardson Landry. Pt reviewed and signed to be a voluntarily pt and taken off of her 72 hour hold. Pt also discussed follow up care. Pt requested a referral for a psychiatrist and DBT therapist. SW scheduled pt with [facility] Dr. Terrilee Croak for medication management as pt terminated services with [facility] and [facility] is able to take new pts and is in network with pt's insurance. SW referred pt to [facility] for DBT therapy as they accept her insurance as well and have DBT trained therapists.

## 2013-12-24 NOTE — Unmapped (Signed)
Patient has been on the unit most part of the shift. Calm and cooperative. Attended groups. Ate 100% of her dinner. Did some coloring with a peer. Denies depression, SI, HI, AVH but when asked about thoughts of self harm, patient said it comes on and off. Patient encouraged to talk to staff when she feels like harming herself which she agreed. Rated anxiety level as 7 and said she has no coping skills. Patient did some coloring and was  encouraged to keep attending groups. Medication compliant.

## 2013-12-24 NOTE — Unmapped (Addendum)
Problem: Leisure Resources Needs  Goal: Demonstrate understanding of leisure resources  Intervention: Provide opportunities for healthy leisure interests  Provide daily opportunities during RT groups to develop healthy leisure interests.      Responsible staff: DESMOND Delma Post, CTRS  Group Note    Patient Name: Jocelyn Schaefer    Date: 12/24/2013       Time: 3:49 PM                Group Type: Recreation Therapy    Group Name: Things    Group Objective: Purpose of this group is to learn a new leisure pursuit, increase socialization and encourage creative thinking.       Attendance: Attended    Interactions: Interacted appropriately    Mood/Affect: Appropriate, Blunted/flat and Calm    Patient Participation: Pt attended Recreation Therapy group. Pt was able to supply answers for each category. After another pt decided to leave, pt chose to end game and start coloring. Pt was social throughout the group.    Nayan Proch [place], CTRS

## 2013-12-24 NOTE — Unmapped (Signed)
Group Note    Patient Name: Jocelyn Schaefer    Date: 12/24/2013       Time: 2:21 PM     Group Type: Enrichment    Group Name: Exploring Grief    Group Objective:The participants in this group shall understand what grief is and the grief process in their lives.      Attendance: Attended    Interactions: Interacted appropriately    Mood/Affect: Appropriate    Patient Participation: Pt attended group and participated actively in the group process.  Pt talked about the loss of her grandmother and her own mental illness.  Pt talked about the challenge she has differentiating between her illness and the feelings and experience of grief.    TIM MCQUADE, DMin

## 2013-12-24 NOTE — Unmapped (Signed)
[facility]  Progress Note    Name: Jocelyn Schaefer  MRN: [id number]  Date: 12/24/2013  Time: 4:44 PM  Total Duration: 35   Psychotherapy Add-On Duration: 17    Interval History:  Patient reports that she feels more level today than yesterday. She reports mild gi upset with lithium however was administered with bactrim. Reports slight improvement in sleep. Calmer and more able to problem solve percieved difficulties in work, home, etc.      Physical Review Of Systems:  A comprehensive review of systems was negative.    Psychiatric Review Of Systems:  Sleep: fair  Interest: varying  Anhedonia: increased  Appetite Changes: decreased  Weight Changes: No change  Energy: decreased  Libido: decreased  Anxiety/Panic:  increased  Guilt: increased   Hopeless: increased  S.I.B.s/risky behavior:  increased    Objective:  Vital signs in last 24 hours:  Temp:  [98.2 ??F (36.8 ??C)] 98.2 ??F (36.8 ??C)  Heart Rate:  [98] 98  Resp:  [16] 16  BP: (139)/(85) 139/85 mmHg    Labs:  Available labs reviewed  within normal limits    Scheduled Meds:  ??? lithium carbonate  300 mg Oral BID WC   ??? risperiDONE  1 mg Oral Nightly (2100)   ??? sulfamethoxazole-trimethoprim  1 tablet Oral Q12H SCH     PRN Meds:.LORazepam, OLANZapine, risperiDONE M-TAB    Mental Status Evaluation:  General     Development :normal    Body Habitus: overweight    Grooming/Hygiene : appropriately dressed     Demeanor: polite and cooperative    Eye Contact:  appropriate  Speech   Rate: Normal   Volume: Normal   Articulation:Normal   Quality: Normal     Motor   Strength/Tone: normal   Atrophy:none   Abnormal Movements: none   Station:normal     Gait: normal  Mood/ Affect   Mood:  depressed and irritable    Affect - Range: normal      - Reactivity: blunted      - Appropriateness: appropriate to mood and/or situation  Thought    Content: normal   Process: normal   Associations: normal   Physical and Psychological Reality Testing : normal  Cognitive   Level of Alertness:  normal   Orientation to: time, place, person and situation   Alert and Oriented in all Spheres: yes   Recent Memory: intact   Remote Memory: intact   Attention/Concentration/Focus: intact   Language: intact   Fund of Knowledge: intact  Safety   Harm to Self: yes,    but improving   Harm to Others: no    Insight/ Judgement   Insight: partial - illness and need for treatment but minimizing impact illness has on functioning   Judgment: fair    Assessment and Plan:  This is a 18 year old female with a history of bipolar disorder   and borderline personality disorder who presents with an acute decompensation   following discontinuation of her psychiatric medications for several months.   It appears that she does have true mood symptoms consistent with bipolar   disorder and that her symptoms are not simply results of the affective   instability of borderline personality disorder as she does have persistent   periods of depression and elevated mood that due to the level of dysfunction   they result or can be classified as manic rather than simply hypomanic. It   appears that medication noncompliance, as well as significant  psychosocial   stressors have contributed to the current admission.   PLAN:   1. With regard to medications, the patient feels that Risperdal was helpful   for her before so we will increase risperdal to 2 mg qhs.with M-tab 0.5 mg by mouth available three times a day as needed for   agitation. After a thorough discussion of the risks and benefits including   the risks of birth defects should the patient become pregnant, as well as   risks of nephrotoxicity and other symptoms of lithium toxicity, we will   continue treatment with lithium at 300 mg by mouth two times a day initially.   We will titrate to an effective treatment dose.   2. The patient acknowledges a history of borderline personality disorder and   feels that this diagnosis accurately explains many of her symptoms. As such,   it is hopeful  that the therapeutic groups on the unit will be helpful for   her. She does express interest in returning to the partial hospital program   or possibly participating in a DBT program at discharge and we would consider   these interventions to be quite helpful for her. We will work as an   Transport planner treatment team to obtain further collateral and to provide   support for the patient, as well as assist her with planning for discharge   and dealing with the various stressors and financial difficulties that she   experiences currently.         Janifer Adie, MD  12/24/2013

## 2013-12-24 NOTE — Unmapped (Signed)
Problem: Coping/Self Expression Impairment  Goal: Increase coping skills  Intervention: Provide leisure coping skills groups  Provide daily RT groups to encourage participation in appropriate leisure coping skills.    Responsible staff: DESMOND Delma Post, CTRS  Name: Jocelyn Schaefer    Date: 12/24/2013    Time: 11:30 AM    Group Type: Recreation Therapy    Group Name: Leisure Imagination    Group Objective: Purpose of this group is to enable participants to feel comfortable in using and expressing their imagination through drawing what they felt was their peaceful and calm place during guided meditation.  Participants were encouraged to provide as much detail as possible in the picture.      Attendance: Did not attend    Interaction: Did not interact    Mood/Affect: Unable to assess    Participation: Pt declined to participate in activity.       Morenike [place], CTRS

## 2013-12-24 NOTE — Unmapped (Signed)
[facility] ED Culture/Lab follow-up  Final Result - Nothing to do.    urine culture: Mixed Skin/Urogenital Flora.

## 2013-12-24 NOTE — Unmapped (Signed)
Pt ate 100% of breakfast and lunch, and engaged in select groups this shift with staff encouragement. Pt is pleasant and cooperative. Pt's goal for today is to talk to her doctor and shower. Pt completed these goals by accomplishing both of these activities. Pt was told she had a missed call from her father and pt replied with that is suspicious, why?.  Pt's mood is despairing and depressed. Affect is flat. SRA is 11.

## 2013-12-24 NOTE — Progress Notes (Signed)
 Jocelyn Schaefer appeared to have slept throughout the entire shift.  Patient had no needs.  15 minute safety checks in place. Will continue to monitor.

## 2013-12-25 MED FILL — LITHIUM CARBONATE 300 MG CAPSULE: 300 300 MG | ORAL | Qty: 1

## 2013-12-25 MED FILL — RISPERIDONE 2 MG TABLET: 2 2 MG | ORAL | Qty: 1

## 2013-12-25 MED FILL — SULFAMETHOXAZOLE 800 MG-TRIMETHOPRIM 160 MG TABLET: 800-160 800-160 mg | ORAL | Qty: 1

## 2013-12-25 NOTE — Unmapped (Signed)
Pt slept 6 1/2 hours this shift. Q 15 minute checks maintained throughout shift per unit protocol.

## 2013-12-25 NOTE — Unmapped (Signed)
Nutrition Group: Focus on Fruits and Vary Your Veggies  Group Description: Patients were asked to list all of the fruits and vegetables they could think of according to color. The importance of fruits and vegetables in a balanced diet was then explained. The differences between water soluble and fat-soluble vitamins were explained and examples of sources of each were provided. Patients were encouraged to include a variety of fruits and vegetables in their diet for the different vitamins and minerals they provide.     Patient did not attend group.  Group Time: 45 minutes    Zlatan Hornback Pendleton RD,LD

## 2013-12-25 NOTE — Unmapped (Signed)
Problem: Leisure Resources Needs  Goal: Demonstrate understanding of leisure resources  Intervention: Provide opportunities for healthy leisure interests  Provide daily opportunities during RT groups to develop healthy leisure interests.      Responsible staff: DESMOND Delma Post, CTRS  Group Note    Patient Name: Jocelyn Schaefer    Date: 12/25/2013       Time: 11:30 AM                Group Type: Recreation Therapy    Group Name: Trivia    Group Objective: Purpose of this group was participation in games that would stimulate social interactions and cognitive recall.  Participants were asked as a group to identify answers to a variety of trivia questions involving random trivia.        Attendance: Attended    Interactions: Interacted appropriately    Mood/Affect: Appropriate and Calm    Patient Participation: Pt participated in Recreation Therapy group. She was able to help answer the trivia questions and was social throughout the group. Pt also drew the pictures needed during the trivia.    Jocelyn Schaefer, CTRS

## 2013-12-25 NOTE — Unmapped (Signed)
Patient was polite and cooperative during this shift. Patient ate 100% of breakfast and lunch, and engaged in select groups this shift. Patient appeared depressed and flat during this shift. Patient spent time napping in her room and socialized with select peers. Patient's SRA is an 7.

## 2013-12-25 NOTE — Unmapped (Signed)
[facility]  Progress Note    Name: Jocelyn Schaefer  MRN: [id number]  Date: 12/25/2013  Time: 3:41 PM  Total Duration: 35   Psychotherapy Add-On Duration: 17    Interval History:  Patient reports that she feels continues to feel more level today than yesterday. She reports mild gi upset with lithium improved when taken with food. Reports continued improvement in sleep. Calmer and more able to problem solve percieved difficulties in work, home, etc.      Physical Review Of Systems:  A comprehensive review of systems was negative.    Psychiatric Review Of Systems:  Sleep: fair  Interest: varying  Anhedonia: increased  Appetite Changes: decreased  Weight Changes: No change  Energy: decreased  Libido: decreased  Anxiety/Panic:  increased  Guilt: increased   Hopeless: increased  S.I.B.s/risky behavior:  increased    Objective:  Vital signs in last 24 hours:  Temp:  [98.2 ??F (36.8 ??C)] 98.2 ??F (36.8 ??C)  Heart Rate:  [114] 114  Resp:  [16] 16  BP: (135)/(75) 135/75 mmHg    Labs:  Available labs reviewed  within normal limits    Scheduled Meds:  ??? lithium carbonate  300 mg Oral BID WC   ??? risperiDONE  2 mg Oral Nightly (2100)     PRN Meds:.LORazepam, OLANZapine, risperiDONE M-TAB    Mental Status Evaluation:  General     Development :normal    Body Habitus: overweight    Grooming/Hygiene : appropriately dressed     Demeanor: polite and cooperative    Eye Contact:  appropriate  Speech   Rate: Normal   Volume: Normal   Articulation:Normal   Quality: Normal     Motor   Strength/Tone: normal   Atrophy:none   Abnormal Movements: none   Station:normal     Gait: normal  Mood/ Affect   Mood:  depressed and irritable    Affect - Range: normal      - Reactivity: blunted      - Appropriateness: appropriate to mood and/or situation  Thought    Content: normal   Process: normal   Associations: normal   Physical and Psychological Reality Testing : normal  Cognitive   Level of Alertness: normal   Orientation to: time, place, person  and situation   Alert and Oriented in all Spheres: yes   Recent Memory: intact   Remote Memory: intact   Attention/Concentration/Focus: intact   Language: intact   Fund of Knowledge: intact  Safety   Harm to Self: yes,    but improving   Harm to Others: no    Insight/ Judgement   Insight: partial - illness and need for treatment but minimizing impact illness has on functioning   Judgment: fair    Assessment and Plan:  This is a 18 year old female with a history of bipolar disorder   and borderline personality disorder who presents with an acute decompensation   following discontinuation of her psychiatric medications for several months.   It appears that she does have true mood symptoms consistent with bipolar   disorder and that her symptoms are not simply results of the affective   instability of borderline personality disorder as she does have persistent   periods of depression and elevated mood that due to the level of dysfunction   they result or can be classified as manic rather than simply hypomanic. It   appears that medication noncompliance, as well as significant psychosocial   stressors have contributed to  the current admission.   PLAN:   1. With regard to medications, the patient feels that Risperdal was helpful   for her before so we will continue risperdal to 2 mg qhs.with M-tab 0.5 mg by mouth available three times a day as needed for   agitation. After a thorough discussion of the risks and benefits including   the risks of birth defects should the patient become pregnant, as well as   risks of nephrotoxicity and other symptoms of lithium toxicity, we will   continue treatment with lithium at 300 mg by mouth two times a day initially.   We will titrate to an effective treatment dose.   2. The patient acknowledges a history of borderline personality disorder and   feels that this diagnosis accurately explains many of her symptoms. As such,   it is hopeful that the therapeutic groups on the unit will  be helpful for   her. She does express interest in returning to the partial hospital program   or possibly participating in a DBT program at discharge and we would consider   these interventions to be quite helpful for her. We will work as an   Transport planner treatment team to obtain further collateral and to provide   support for the patient, as well as assist her with planning for discharge   and dealing with the various stressors and financial difficulties that she   experiences currently.         Janifer Adie, MD  12/25/2013

## 2013-12-25 NOTE — Unmapped (Signed)
Analyah was blunted and anxious. Her SRT was 7 from day shift, she admitted to fleeting thoughts but no plan for suicide. She was compliant with medications. She was worried about stomach upset from the Lithium but opted to take it after dinner and reported no complaints of stomach discomfort. She attended unit programming. She completed a wrap-up sheet and reported she felt impulsive, aggressive, agitated and nervous which was incongruent with her affect this shift. She used distraction as a Associate Professor. She rated anxiety 6/10 and depression 2/10. She was polite with care. She attended to her ADLs this shift.

## 2013-12-25 NOTE — Unmapped (Signed)
Problem: Disposition  Intervention: Disposition Intervention  Social worker will assist the patient in planning their discharge as evidenced by discussing relapse prevention including treatment recommendations, providing referrals and assisting in securing follow up appointments.    Responsible staff: Haroon Shatto Ladoris Gene, MSW, LSW   SW met with pt to review 72 hour tx plan update and discharge plans. Pt reviewed and signed tx plan. Pt refused referral for PHP, so plans to follow up with therapist referral on Monday and psychiatrist referral in a week.

## 2013-12-25 NOTE — Unmapped (Signed)
Problem: Coping/Self Expression Impairment  Goal: Increase coping skills  Intervention: Provide leisure coping skills groups  Provide daily RT groups to encourage participation in appropriate leisure coping skills.    Responsible staff: DESMOND Delma Post, CTRS  Group Note    Patient Name: Jocelyn Schaefer    Date: 12/25/2013       Time: 4:08 PM                Group Type: Recreation Therapy    Group Name: Pet Therapy    Group Objective: Purpose of this group is to increase pt socialization, relieve stress of hospitalization, and elevate patient???s mood.  Patients pet, feed, play with a certified therapy dog.  Pts talk about their own pets and past experiences they have had with pets.      Attendance: Attended    Interactions: Interacted appropriately    Mood/Affect: Appropriate, Blunted/flat and Calm    Patient Participation: Pt attended Recreation Therapy group. She sat in a chair near the dog. Occasionally she would reach down and pet him. After 25 minutes, pt excused herself and went into the group room to watch TV.    Maylyn Narvaiz Mayford Knife, CTRS

## 2013-12-26 MED ORDER — risperiDONE (RISPERDAL) 2 MG tablet
2 | ORAL_TABLET | Freq: Every evening | ORAL | Status: AC
Start: 2013-12-26 — End: 2014-03-14

## 2013-12-26 MED ORDER — lithium carbonate 300 MG capsule
300 | ORAL_CAPSULE | Freq: Two times a day (BID) | ORAL | 0.00 refills | 30.00000 days | Status: AC
Start: 2013-12-26 — End: 2014-03-14

## 2013-12-26 MED FILL — LITHIUM CARBONATE 300 MG CAPSULE: 300 300 MG | ORAL | Qty: 1

## 2013-12-26 MED FILL — RISPERIDONE 2 MG TABLET: 2 2 MG | ORAL | Qty: 1

## 2013-12-26 NOTE — Unmapped (Signed)
Patient is medication compliant.

## 2013-12-26 NOTE — Unmapped (Signed)
Pt slept 6 1/2 hours this shift. Q 15 minute checks maintained this shift per unit protocol.

## 2013-12-26 NOTE — Unmapped (Signed)
Patient was polite and cooperative during this shift. Patient ate 100% of breakfast and lunch, and engaged in most groups this shift. Patient appeared anxious during this shift; at one point leaving group due to anxiety related to hypochondia (a peer stated that they feel ill in the group), patient returned to group after speaking with staff and managing anxiety. Patient spent time napping in her room and socialized with select peers. Patient denies HI, minimal SI and Patient's SRA is a 7.

## 2013-12-26 NOTE — Unmapped (Signed)
Problem: Leisure Resources Needs  Goal: Demonstrate understanding of leisure resources  Intervention: Provide opportunities for healthy leisure interests  Provide daily opportunities during RT groups to develop healthy leisure interests.      Responsible staff: DESMOND Delma Post, CTRS  Group Note    Patient Name: Jocelyn Schaefer    Date: 12/26/2013       Time: 2:24 PM                Group Type: Recreation Therapy    Group Name: Wynelle Link Catchers    Group Objective:Purpose of this group was to allow for participants to express their creativity while making window sun catchers.   Discussions during group centered on how participants felt while engaging in a leisure pursuit instead of sitting around and not engaging in a leisure pursuit. Participants were also informed on where to purchase supplies to make more on their own after discharge.      Attendance: Attended    Interactions: Interacted appropriately    Mood/Affect: Appropriate    Patient Participation: Pt engaged in group activity. Pt was able to use creativity to design sun catcher. Pt congruent, social with staff and peers.     Alic Hilburn E Evalise Abruzzese, CTRS

## 2013-12-26 NOTE — Unmapped (Signed)
Problem: Disposition  Intervention: Child psychotherapist will provide group therapy  that encourages increased coping skills, insight building and discharge preparedness.    Responsible staff: JEFFREY Ladoris Gene, MSW, LSW   Group Note    Patient Name: Jocelyn Schaefer    Date:12/26/2013         Time: 01:00 PM                Group Type: Social Work    Group Name: Relapse Prevention Planning    Group Objective:SW discussed with pts relapse prevention planning.SW discussed how to recognize early warning signs, identifying triggers, helpful vs unhelpful support, identifying feelings that are difficult to express, identifying obstacles to follow through, and how to utilize coping skills in recovery.      Attendance: Did Not Attend    Interactions:N/A    Mood/Affect: N/A    Patient Participation: Pt did not attend group.     Levonne Carreras A Kohan Azizi, LISW-S

## 2013-12-26 NOTE — Unmapped (Signed)
Problem: Leisure Resources Needs  Goal: Demonstrate understanding of leisure resources  Intervention: Provide opportunities for healthy leisure interests  Provide daily opportunities during RT groups to develop healthy leisure interests.      Responsible staff: DESMOND Delma Post, CTRS  Group Note    Patient Name: Cynthya Yam    Date: 12/26/2013       Time: 4:21 PM                Group Type: Recreation Therapy    Group Name: Picture Frames    Group Objective:Purpose of this group was to allow for creative expression.  Participants were given a wooden frame to paint as they would like.  Various supplies were brought up for participants to use of the frame such as paint, glitter, ribbons, etc.  Discussions centered around other leisure interests and ways to creatively express yourself.       Attendance: Attended    Interactions: Interacted appropriately    Mood/Affect: Appropriate    Patient Participation: Pt engaged in group activity. Pt was able to use creativity to paint frames. Pt congruent, social with staff and peer.     Meena Barrantes E Rena Hunke, CTRS

## 2013-12-26 NOTE — Unmapped (Signed)
[facility]  Progress Note    Name: Jocelyn Schaefer  MRN: [id number]  Date: 12/26/2013  Time: 1:09 PM  Total Duration: 35   Psychotherapy Add-On Duration: 17    Interval History:  Patient reports that her mood continues to improve. Reports continued improvement in sleep. Taking lithium at meals with no gi upset.  Calmer and more able to problem solve percieved difficulties in work, home, etc.   Feels that she will be able to return to work Monday and declines PHP    Physical Review Of Systems:  A comprehensive review of systems was negative.    Psychiatric Review Of Systems:  Sleep: fair  Interest: varying  Anhedonia: increased  Appetite Changes: decreased  Weight Changes: No change  Energy: decreased  Libido: decreased  Anxiety/Panic:  increased  Guilt: increased   Hopeless: increased  S.I.B.s/risky behavior:  increased    Objective:  Vital signs in last 24 hours:  Temp:  [98.1 ??F (36.7 ??C)] 98.1 ??F (36.7 ??C)  Heart Rate:  [102] 102  Resp:  [16] 16  BP: (132)/(70) 132/70 mmHg    Labs:  Available labs reviewed  within normal limits    Scheduled Meds:  ??? lithium carbonate  300 mg Oral BID WC   ??? risperiDONE  2 mg Oral Nightly (2100)     PRN Meds:.LORazepam, OLANZapine, risperiDONE M-TAB    Mental Status Evaluation:  General     Development :normal    Body Habitus: overweight    Grooming/Hygiene : appropriately dressed     Demeanor: polite and cooperative    Eye Contact:  appropriate  Speech   Rate: Normal   Volume: Normal   Articulation:Normal   Quality: Normal     Motor   Strength/Tone: normal   Atrophy:none   Abnormal Movements: none   Station:normal     Gait: normal  Mood/ Affect   Mood:  depressed and irritable    Affect - Range: normal      - Reactivity: blunted      - Appropriateness: appropriate to mood and/or situation  Thought    Content: normal   Process: normal   Associations: normal   Physical and Psychological Reality Testing : normal  Cognitive   Level of Alertness: normal   Orientation to: time,  place, person and situation   Alert and Oriented in all Spheres: yes   Recent Memory: intact   Remote Memory: intact   Attention/Concentration/Focus: intact   Language: intact   Fund of Knowledge: intact  Safety   Harm to Self: yes,    but improving   Harm to Others: no    Insight/ Judgement   Insight: partial - illness and need for treatment but minimizing impact illness has on functioning   Judgment: fair    Assessment and Plan:  This is a 18 year old female with a history of mg qhs.with M-tab 0.5 mg by mouth available three times a day as needed for bipolar disorder   and borderline personality disorder who presents with an acute decompensation   following discontinuation of her psychiatric medications for several months.   It appears that she does have true mood symptoms consistent with bipolar   disorder and that her symptoms are not simply results of the affective   instability of borderline personality disorder as she does have persistent   periods of depression and elevated mood that due to the level of dysfunction   they result or can be classified as manic rather  than simply hypomanic. It   appears that medication noncompliance, as well as significant psychosocial   stressors have contributed to the current admission.   PLAN:   1. With regard to medications, the patient feels that Risperdal was helpful   for her before so we will continue risperdal to 2   agitation. After a thorough discussion of the risks and benefits including   the risks of birth defects should the patient become pregnant, as well as   risks of nephrotoxicity and other symptoms of lithium toxicity, we will   continue treatment with lithium at 300 mg by mouth two times a day initially.   We will check level prior to d/c but given clinical response may allow subtherapeutic dose.   2. The patient acknowledges a history of borderline personality disorder and   feels that this diagnosis accurately explains many of her symptoms. As such,   it  is hopeful that the therapeutic groups on the unit will be helpful for   her. She does express interest in returning to the partial hospital program   or possibly participating in a DBT program at discharge and we would consider   these interventions to be quite helpful for her. We will work as an   Transport planner treatment team to obtain further collateral and to provide   support for the patient, as well as assist her with planning for discharge   and dealing with the various stressors and financial difficulties that she   experiences currently.   3,. Plan for d/c tomorrow on current med regimen. May decrease li if level elevated otherwise home on current regimen.          Janifer Adie, MD  12/26/2013

## 2013-12-26 NOTE — Unmapped (Signed)
Patient had a responsive affect this shift.  Her mood was anxious, rated a 9/10, with 10 being high, because she is worried about going home.  Patient plans to take a couple days off of work, post discharge, to re-adjust.  She is to be discharged tomorrow.  Patient continues to experience intrusive thoughts, says she becomes dizzy in the morning, upon standing, patient also experiencing racing thoughts, and paranoid thinking.  Patient does endorse SI, without a plan, but says she plans to stay safe.  She attended and engaged in all offered programming this shift, that was on the unit.  Patient is social with staff and peers, she is polite and cooperative.

## 2013-12-27 LAB — COMPREHENSIVE METABOLIC PANEL, SERUM
ALT: 9 U/L (ref 7–52)
AST (SGOT): 10 U/L (ref 13–39)
Albumin: 4 g/dL (ref 3.5–5.7)
Alkaline Phosphatase: 62 U/L (ref 36–125)
Anion Gap: 6 mmol/L (ref 3–16)
BUN: 8 mg/dL (ref 7–25)
CO2: 27 mmol/L (ref 21–33)
Calcium: 9.2 mg/dL (ref 8.6–10.3)
Chloride: 105 mmol/L (ref 98–110)
Creatinine: 0.72 mg/dL (ref 0.60–1.30)
GFR MDRD Af Amer: 125 See note.
GFR MDRD Non Af Amer: 103 See note.
Glucose: 82 mg/dL (ref 70–100)
Osmolality, Calculated: 283 mOsm/kg (ref 278–305)
Potassium: 4.5 mmol/L (ref 3.5–5.3)
Sodium: 138 mmol/L (ref 133–146)
Total Bilirubin: 0.8 mg/dL (ref 0.0–1.5)
Total Protein: 6.7 g/dL (ref 6.4–8.9)

## 2013-12-27 LAB — TSH: TSH: 3.13 u[IU]/mL (ref 0.34–5.60)

## 2013-12-27 LAB — T4, FREE: Free T4: 0.8 ng/dL (ref 0.61–1.76)

## 2013-12-27 LAB — LITHIUM LEVEL: Lithium Lvl: <0.1 mmol/L (ref 0.6–1.2)

## 2013-12-27 MED FILL — LITHIUM CARBONATE 300 MG CAPSULE: 300 300 MG | ORAL | Qty: 1

## 2013-12-27 NOTE — Unmapped (Signed)
Pt slept 6  hours this shift. Q 15 minute checks maintained throughout shift per unit protocol.

## 2013-12-27 NOTE — Unmapped (Signed)
Problem: Disposition  Goal: Disposition Goal  Long Term Goal: Patient will have a successful discharge as evidenced by verbalizing a commitment to ongoing care and having appropriate follow up appointments in place to support her/his ongoing recovery in the community.   Pt. Discharged form unit at 1115, accompanied by female friend and escorted by staff.  Pt. belongings were returned and signed and for and prescriptions were given for Lithium 300mg  po BID #60 and Risperidone 2mg  po QHS #30.  Discharge instructions were reviewed and signed and pt. Reported no questions or concerns.  Pt. encouraged to follow-up with [facility] or outpatient  Provider should any questions arise or call 911 in case of emergency, including return of suicidal thoughts.  Pt. Expressed understanding.  Pt. Denied SI at discharge and stated she felt anxious about discharge but also felt good to be leaving the hospital.  SRA ws 6 at discharge.

## 2013-12-27 NOTE — Unmapped (Signed)
[facility]  Progress Note    Name: Jocelyn Schaefer  MRN: [id number]  Date: 12/27/2013    Total Duration: 35   Psychotherapy Add-On Duration: N/A    Interval History:  Covering for Dr. Richardson Landry. MD signout and RN report reviewed, pt. d/w tx team.  Dr. Richardson Landry will complete d/c summary.  Pt. feels a lot better.  She has anxiety about d/c and return to work.  She is not depressed.  Denies SI.  Slept better last hs than she has in awhile.  Med side effects resolved.  Would like to return to work on 9/30.    Physical Review Of Systems:  A comprehensive review of systems was negative.    Psychiatric Review Of Systems:  Sleep: good  Interest: varying  Anhedonia: increased  Appetite Changes: decreased  Weight Changes: No change  Energy: decreased  Libido: decreased  Anxiety/Panic:  increased  Guilt: increased   Hopeless: increased  S.I.B.s/risky behavior:  increased    Objective:  Vital signs in last 24 hours:  Temp:  [98.5 ??F (36.9 ??C)] 98.5 ??F (36.9 ??C)  Heart Rate:  [111] 111  Resp:  [16] 16  BP: (122)/(84) 122/84 mmHg    Labs:  Available labs reviewed  within normal limits    Scheduled Meds:    Shaylan, [place]   Home Medication Instructions QMV:[id number]    Printed on:12/27/13 2029   Medication Information                      lithium carbonate 300 MG capsule  Take 1 capsule (300 mg total) by mouth 2 times a day with meals.             risperiDONE (RISPERDAL) 2 MG tablet  Take 1 tablet (2 mg total) by mouth at bedtime.               Mental Status Evaluation:  General     Development :normal    Body Habitus: overweight    Grooming/Hygiene : appropriately dressed     Demeanor: polite and cooperative    Eye Contact:  appropriate  Speech   Rate: Normal   Volume: Normal   Articulation:Normal   Quality: Normal  Motor   Strength/Tone: normal   Atrophy:none   Abnormal Movements: none   Station:normal     Gait: normal  Mood/ Affect   Mood:  depressed and irritable    Affect - Range: normal      - Reactivity: blunted      -  Appropriateness: appropriate to mood and/or situation  Thought    Content: normal   Process: normal   Associations: normal   Physical and Psychological Reality Testing : normal  Cognitive   Level of Alertness: normal   Orientation to: time, place, person and situation   Alert and Oriented in all Spheres: yes   Recent Memory: intact   Remote Memory: intact   Attention/Concentration/Focus: intact   Language: intact   Fund of Knowledge: intact  Safety   Harm to Self: yes,    but improving   Harm to Others: no  Insight/ Judgement   Insight: partial - illness and need for treatment but minimizing impact illness has on functioning   Judgment: fair    Assessment and Plan:  D/c home on lithium and Risperdal.  See SW note for d/c plans.    Susa Loffler, MD  12/27/2013

## 2013-12-27 NOTE — Unmapped (Signed)
Problem: Disposition  Intervention: Disposition Intervention  Social worker will assist the patient in planning their discharge as evidenced by discussing relapse prevention including treatment recommendations, providing referrals and assisting in securing follow up appointments.    Responsible staff: Adalina Dopson Ladoris Gene, MSW, LSW   Discharge Note:  Pt leaving today per Dr. Laural Benes with mother to transport home. Pt reviewed and signed the discharge treatment plan but refused the satisfaction survey using. Pt signed ROIs for outpatient providers. Pt has 30 days scripts for medications. Pt and social worker reviewed  discharge safety plan. Pt will follow up with Adair Laundry at [facility] on 01/06/14 at 10:30 am for medication management and Thurnell Garbe from [facility] at their [place] office on 12/30/13 at 11:45 am for individual therapy. Pt's mood was calm and affect was appropriate, denied SI/HI, denied A/V hallucinations, appeared alert and oriented x4, appeared to have organized thoughts.
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
## 2014-03-14 ENCOUNTER — Inpatient Hospital Stay
Admit: 2014-03-14 | Discharge: 2014-03-14 | Disposition: A | Discharge status: Home / Self Care | Payer: PRIVATE HEALTH INSURANCE

## 2014-03-14 DIAGNOSIS — N390 Urinary tract infection, site not specified: Secondary | ICD-10-CM

## 2014-03-14 LAB — URINALYSIS W/RFL TO MICROSCOPIC
Bilirubin, UA: NEGATIVE
Glucose, UA: NEGATIVE mg/dL
Ketones, UA: NEGATIVE mg/dL
Nitrite, UA: NEGATIVE
Protein, UA: 100 mg/dL — AB
RBC, UA: >100 /HPF — ABNORMAL HIGH (ref 0–3)
Specific Gravity, UA: 1.019 (ref 1.005–1.035)
Squam Epithel, UA: 20 /HPF — ABNORMAL HIGH (ref 0–5)
Urobilinogen, UA: <2 mg/dL (ref 0.2–1.9)
WBC, UA: >100 /HPF — ABNORMAL HIGH (ref 0–5)
pH, UA: 7 (ref 5.0–8.0)

## 2014-03-14 LAB — URINE CULTURE: Culture Result: >100000

## 2014-03-14 LAB — HCG URINE, QUALITATIVE: Preg Test, Ur: NEGATIVE

## 2014-03-14 MED ORDER — cephALEXin (KEFLEX) capsule 500 mg
500 | Freq: Once | ORAL | Status: AC
Start: 2014-03-14 — End: 2014-03-14
  Administered 2014-03-14: 23:00:00 500 mg via ORAL

## 2014-03-14 MED ORDER — cephALEXin (KEFLEX) 500 MG capsule
500 | ORAL_CAPSULE | Freq: Four times a day (QID) | ORAL | Status: AC
Start: 2014-03-14 — End: 2014-03-24

## 2014-03-14 MED ORDER — lactobacillus rhamnosus, GG, (CULTURELLE) 10 billion cell capsule
10 | ORAL_CAPSULE | Freq: Every day | ORAL | Status: AC
Start: 2014-03-14 — End: ?

## 2014-03-14 MED FILL — CEPHALEXIN 500 MG CAPSULE: 500 500 MG | ORAL | Qty: 1

## 2014-03-14 NOTE — Unmapped (Signed)
Pt c/o pain with urination and back pain for 3 days

## 2014-03-14 NOTE — Unmapped (Signed)
[facility] Note    Date of Service:  03/14/2014  Reason for Visit per Triage: Dysuria    Patient History     HPI:  Jocelyn Schaefer is a 18 y.o. female who presents with a chief complaint of dysuria.  The patient reports she has had this for the last 3 days.  She reports pain with urination and it is a sharp pain that radiates to her bilateral back and nothing makes better/worse.  The patient reports she has had urine infections in the past and this feels similar.  She also reports pain that goes across the lower abdomen equal on both sides and worse in the middle.  The patient denies any vaginal discharge, vaginal bleeding, nausea, vomiting, diarrhea, constipation, chest pain, shortness of breath, rash.  The patient is not sexually active and is not concerned about sexually transmitted disease.    There were no other associated signs or symptoms or modifying factors.    Past Medical History   Diagnosis Date   ??? Depression    ??? Anxiety    ??? Bipolar disorder    ??? Asthma    ??? Borderline personality disorder      Past Surgical History   Procedure Laterality Date   ??? Tonsillectomy  2001   ??? Tympanostomy tube placement  2001     Social History:  Jocelyn Schaefer  reports that she has been smoking.  She has never used smokeless tobacco. She reports that she drinks alcohol. She reports that she uses illicit drugs (Marijuana).    Family History:  The patient was not aware of any other relevant diseases that run in the family other than what is stated above.    Previous Medications    No medications on file     Allergies:  Allergies as of 03/14/2014   ??? (No Known Allergies)       Review of Systems     ROS:  Please see HPI.  All other ROS were reviewed with the patient and were negative.      Physical Exam     ED Triage Vitals   Vital Signs Group      Temp 03/14/14 1605 98.8 ??F (37.1 ??C)      Temp src --       Heart Rate 03/14/14 1605 100      Heart Rate Source --       Resp  03/14/14 1605 20      SpO2 03/14/14 1605 98 %      BP 03/14/14 1605 138/89 mmHg      BP Location --       BP Method --       Patient Position --    SpO2 03/14/14 1605 98 %   O2 Device 03/14/14 1605 None (Room air)     General: Mild acute distress, appears stated age, good hygiene, normal development  HEENT: Normocephalic/Atraumatic, PERRL, EOMI, no conjunctival injection/icterus/discharge, nasal cavity clear and without edema/erythema/lesions, oropharynx clear without lesions/exudate/erythema,  mucous membranes moist  Neck: No lymphadenopathy, no thyromegaly, trachea midline, no JVD  CV: RRR, clear S1/S2, no murmurs  Lungs: Clear to ausculation bilaterally, good breath sounds, no rales/rhonchi/wheeze/stridor  Abdomen: Nontender, nondistended, soft, no organomegaly, +BS  GU: +suprapubic tenderness, +bilateral CVA tenderness  Musculoskeletal: Normal muscle bulk, full ROM, no joint swelling/erythema/pain  Neuro: AAO, no gross neurological deficits  Skin: No rash, dry  Extremities: No cyanosis, good capillary refill, good pulses, no edema, no calf tenderness  Psych: Pleasant, conversant, answers appropriately      Diagnostic Studies     Labs:  Please see electronic medical record for any tests performed in the ED     Radiology:  Please see electronic medical record for any tests performed in the ED      Emergency Department Procedures       Emergency Department Course and Medical Decision Making     Medications Administered:  Medications   cephALEXin (KEFLEX) capsule 500 mg (not administered)       The patient was admitted to the emergency department and evaluated by myself.  Nursing notes and old charts were reviewed including PMH/PSH/Meds/Allergies/FH/SH.      Given the above we did a urinalysis that showed obvious signs of infection.  I also reviewed her urine and it was very cloudy and filled with sediment.  As such, this seems to be consistent with a urine infection.  Given she has pain radiating to the back, she  may have early pyelonephritis.  I will treat her with 10 days of antibiotics and Lactobacillus to prevent antibiotic associated diarrhea.  The patient is not sexually active and denies any vaginal complaints, so there is no need for a pelvic exam.  She has dysuria and her symptoms are classic for urinary tract infection, so there is no need to evaluate for other causes of abdominal pain such as pancreatitis, hepatitis, appendicitis, ovarian torsion.    The patient and/or family were informed of the results of any tests, time was given to answer all questions, and a plan was proposed and all agreed with this plan.    Impression:  1. Complicated UTI (urinary tract infection)      Disposition:  Discharged from the ED. See AVS for prescriptions, followup, and discharge instructions.     Plan:  The overall plan is stated above.  My standard return precautions and/or follow-up instructions were given.    Discharge Medications:  New Prescriptions    CEPHALEXIN (KEFLEX) 500 MG CAPSULE    Take 1 capsule (500 mg total) by mouth 4 times a day for 10 days.    LACTOBACILLUS RHAMNOSUS, GG, (CULTURELLE) 10 BILLION CELL CAPSULE    Take 1 capsule by mouth daily.     Follow-up Information:  No follow-up provider specified.    Critical Care Time           Jules Husbands, MD  03/14/14 [phone]

## 2014-03-14 NOTE — Unmapped (Signed)
Clean catch urine sent to lab.

## 2014-03-14 NOTE — Unmapped (Signed)
Take all of your regular medications and the medications prescribed today as directed.\\  Take either lactobacillus or eat yogurt to prevent antibiotic associated diarrhea.  Please follow-up with your regular doctor within the next 1 week.  If you do not have a regular doctor, we can provide you with a list of doctors.  Return to the Emergency Department for any worsening of your symptoms or for any new symptoms that concern you.

## 2014-03-21 NOTE — Unmapped (Signed)
Emergency Department Follow Up Note: Culture Results    Jocelyn Schaefer is a 18 y.o. female who had the following results from cultures collected while in the [facility] Emergency Department:     Culture Result Staphylococcus saprophyticus ??   Culture Result >100,000 cfu/mL ??   Culture Result Staph saprophyticus isolates generally respond to urinary concentrations of antimicrobial agents commonly used to treat acute, uncomplicated UTI (eg. Nitrofurantoin, Trimethoprim +/- Sulfamethoxazole, or a Fluoroquinolone)             Patient was discharged on the following medications: cephalexin 500 mg PO q6h x 10 days    Patient has accurate antibiotic coverage for this infection. No further action at this time.    Marcie A Malone Pharm.D.  [phone]

## 2014-06-19 ENCOUNTER — Encounter: Payer: PRIVATE HEALTH INSURANCE | Attending: Adult Health

## 2015-04-14 ENCOUNTER — Ambulatory Visit: Admit: 2015-04-14 | Discharge: 2015-04-14 | Payer: PRIVATE HEALTH INSURANCE

## 2015-04-14 DIAGNOSIS — N390 Urinary tract infection, site not specified: Secondary | ICD-10-CM

## 2015-04-14 MED ORDER — spironolactone (ALDACTONE) 50 MG tablet
50 | ORAL_TABLET | Freq: Two times a day (BID) | ORAL | Status: AC
Start: 2015-04-14 — End: 2015-05-12

## 2015-04-14 MED ORDER — sulfamethoxazole-trimethoprim (BACTRIM DS) 800-160 mg per tablet
800-160 | ORAL_TABLET | Freq: Every day | ORAL | Status: AC
Start: 2015-04-14 — End: ?

## 2015-04-14 NOTE — Unmapped (Signed)
Subjective:      Jocelyn Schaefer is a 20 y.o. female who complains of reccurent UTIs burning with urination.Patient does have a history of recurrent UTI.around 3/year Patient does have a history of pyelonephritis. Once and admitted for it   Has seen a urologist . No structural reason found for UTIs  Pt is sexually active bisexual but mainly lesbian    Has bipolar and anxiety and phobia of world ending had tried multiple meds but stopped all meds due to side effects   Psychotherapy hasnt helped much   Mother is good support   Has delusions but talks herself out of them   Works as Clinical biochemist for [employer]     Has few pimples on anterior chest and face and back for a year      The following portions of the patient's history were reviewed and updated as appropriate: allergies, current medications, past family history, past medical history, past social history, past surgical history and problem list.    Review of Systems  Pertinent items are noted in HPI.    Review of Systems   Review of Systems - General ROS: negative for - fatigue, malaise, night sweats or weight change    Ophthalmic ROS: negative for - excessive tearing, eye pain, itchy eyes or photophobia  ENT ROS: negative for - epistaxis, sinus pain, tinnitus or visual changes  Allergy and Immunology ROS: negative for - hives or seasonal allergies  Hematological and Lymphatic ROS: negative for - bleeding problems, fatigue or weight loss  Endocrine ROS: negative for - hot flashes, polydipsia/polyuria or unexpected weight changes  Breast ROS: negative  Respiratory ROS: no cough, shortness of breath, or wheezing  Cardiovascular ROS: no chest pain or dyspnea on exertion  Gastrointestinal ROS: no abdominal pain, change in bowel habits, or black or bloody stools  Genito-Urinary ROS: no dysuria, trouble voiding, or hematuria  Musculoskeletal ROS: negative for - gait disturbance, joint pain or muscle pain  Neurological ROS: negative for - bowel and bladder control changes,  impaired coordination/balance, seizures or visual changes  Dermatological ROS: negative for nail changes, rash and skin lesion changes  Objective:      BP 140/82 mmHg   Pulse 101   Temp(Src) 97.7 ??F (36.5 ??C) (Oral)   Ht 5' 6.75 (1.695 m)   Wt 227 lb (102.967 kg)   BMI 35.84 kg/m2   SpO2 96%    General Appearance:    Alert, cooperative, no distress, appears stated age   Head:    Normocephalic, without obvious abnormality, atraumatic   Eyes:    PERRL, conjunctiva/corneas clear, EOM's intact, fundi     benign, both eyes               Neck:   Supple, symmetrical, trachea midline, no adenopathy;     thyroid:  no enlargement/tenderness/nodules; no carotid    bruit or JVD   Back:     Symmetric, no curvature, ROM normal, no CVA tenderness   Lungs:     Clear to auscultation bilaterally, respirations unlabored   Chest Wall:    No tenderness or deformity    Heart:    Regular rate and rhythm, S1 and S2 normal, no murmur, rub   or gallop                   Extremities:   Extremities normal, atraumatic, no cyanosis or edema   Pulses:   2+ and symmetric all extremities   Skin:   Skin  color, texture, turgor normal, cystic acne on back , 2-3 only on chest none on face                     Assessment:     1. Frequent UTI  Hydration   Daily cranberry juice ,  One Bactrim DS after intercourse    2. Acne, unspecified acne type  spironolactone   3. Bipolar 1 disorder controlled  Refused meds    4. Anxiety     5. Borderline personality disorder     6. Sacroiliitis seen on CT scan  Rheumatoid Arthritis, Diagnostic Panel    ANA w/reflex to IFA/Comp Panel    Sed Rate     F/u 1 month

## 2015-05-12 ENCOUNTER — Ambulatory Visit: Admit: 2015-05-12 | Discharge: 2015-05-12 | Payer: PRIVATE HEALTH INSURANCE

## 2015-05-12 DIAGNOSIS — F410 Panic disorder [episodic paroxysmal anxiety] without agoraphobia: Secondary | ICD-10-CM

## 2015-05-12 MED ORDER — FLUoxetine (PROZAC) 20 MG tablet
20 | ORAL_TABLET | Freq: Every day | ORAL | Status: AC
Start: 2015-05-12 — End: 2015-07-02

## 2015-05-12 NOTE — Unmapped (Signed)
Subjective:      Jocelyn Schaefer is a 20 y.o. 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    Review of Systems  Pertinent items are noted in HPI.    Review of Systems   Review of Systems - General ROS: negative for - fatigue, malaise, night sweats or weight change    Ophthalmic ROS: negative for - excessive tearing, eye pain, itchy eyes or photophobia  ENT ROS: negative for - epistaxis, sinus pain, tinnitus or visual changes  Allergy and Immunology ROS: negative for - hives or seasonal allergies  Hematological and Lymphatic ROS: negative for - bleeding problems, fatigue or weight loss  Endocrine ROS: negative for - hot flashes, polydipsia/polyuria or unexpected weight changes  Breast ROS: negative  Respiratory ROS: no cough, shortness of breath, or wheezing  Cardiovascular ROS: no chest pain or dyspnea on exertion  Gastrointestinal ROS: no abdominal pain, change in bowel habits, or black or bloody stools  Genito-Urinary ROS: no dysuria, trouble voiding, or hematuria  Musculoskeletal ROS: negative for - gait disturbance, joint pain or muscle pain  Neurological ROS: negative for - bowel and bladder control changes, impaired coordination/balance, seizures or visual changes  Dermatological ROS: negative for nail changes, rash and skin lesion changes  Objective:      BP 120/86 mmHg   Pulse 115   Ht 5' 6.75 (1.695  m)   Wt 229 lb (103.874 kg)   BMI 36.15 kg/m2   SpO2 97%    General Appearance:    Alert, cooperative, no distress, appears stated age, ring in nose   Head:    Normocephalic, without obvious abnormality, atraumatic   Eyes:    PERRL, conjunctiva/corneas clear, EOM's intact, fundi     benign, both eyes               Neck:   Supple, symmetrical, trachea midline, no adenopathy;     thyroid:  no enlargement/tenderness/nodules; no carotid    bruit or JVD   Back:     Symmetric, no curvature, ROM normal, no CVA tenderness   Lungs:     Clear to auscultation bilaterally, respirations unlabored   Chest Wall:    No tenderness or deformity    Heart:    Regular rate and rhythm, S1 and S2 normal, no murmur, rub   or gallop                   Extremities:   Extremities normal, atraumatic, no cyanosis or edema   Pulses:   2+ and symmetric all extremities   Skin:   Skin color, texture, turgor normal, cystic acne on back , 2-3 only on chest none on face  Assessment:     1. Severe anxiety with panic  Psychology    Psychiatry   Agreed to try prozac for now   2. Intolerance to spironolactone for acne , advised to stop it    F/u 1-2  month

## 2015-05-19 NOTE — Unmapped (Signed)
CALLED [facility] IN REGARDS TO APPOINTMENT FOR 3.22.17 BEING CANCELLED AND NEEDING TO BE RE-SCHEDULED.

## 2015-07-01 ENCOUNTER — Ambulatory Visit: Admit: 2015-07-01 | Discharge: 2015-07-01 | Payer: PRIVATE HEALTH INSURANCE

## 2015-07-01 DIAGNOSIS — F3181 Bipolar II disorder: Secondary | ICD-10-CM

## 2015-07-01 DIAGNOSIS — F22 Delusional disorders: Secondary | ICD-10-CM

## 2015-07-01 MED ORDER — acetaminophen (TYLENOL) tablet 650 mg
325 | ORAL | Status: AC | PRN
Start: 2015-07-01 — End: 2015-07-07
  Administered 2015-07-05 – 2015-07-06 (×3): 650 mg via ORAL

## 2015-07-01 MED ORDER — olanzapine zydis (ZYPREXA) disintegrating tablet 5 mg
5 | ORAL | Status: AC | PRN
Start: 2015-07-01 — End: 2015-07-07
  Administered 2015-07-04: 02:00:00 5 mg via ORAL

## 2015-07-01 MED ORDER — ziprasidone (GEODON) injection 20 mg
20 | INTRAMUSCULAR | Status: AC | PRN
Start: 2015-07-01 — End: 2015-07-07

## 2015-07-01 MED ORDER — nicotine (NICODERM CQ) 21 mg/24 hr 1 patch
21 | Freq: Every day | TRANSDERMAL | Status: AC
Start: 2015-07-01 — End: 2015-07-07
  Administered 2015-07-02 – 2015-07-04 (×3): 1 via TRANSDERMAL

## 2015-07-01 MED ORDER — LORazepam (ATIVAN) tablet 0.5 mg
0.5 | ORAL | Status: AC | PRN
Start: 2015-07-01 — End: 2015-07-02
  Administered 2015-07-02: 01:00:00 0.5 mg via ORAL

## 2015-07-01 MED FILL — LORAZEPAM 0.5 MG TABLET: 0.5 0.5 MG | ORAL | Qty: 1

## 2015-07-01 NOTE — Unmapped (Signed)
Patient arrived via private car states feeling increased suicidal thoughts.States went to dr and they suggested she come in for admission. Patient anxious but cooperative Dr Shana Chute called and agreed to admit patient. Gunnar Fusi rn   called  For report

## 2015-07-01 NOTE — Unmapped (Signed)
07/01/15 1615 The pt. States she is safe until her mother brings her in this eve. This Clinical research associate explained to the pt. That if she would begin to feel unsafe, to call the [facility] or call 911, she verbalized understanding.

## 2015-07-01 NOTE — Unmapped (Signed)
Subjective:      Jocelyn Schaefer is a 20 y.o. female here for f/u anxiety    Has bipolar and anxiety and phobia of world ending had tried multiple meds but stopped all meds due to side effects , tried prozac few weeks ago gave her worse anxiety and stomach pain   Psychotherapy hasnt helped much ,but agreed to go again , has appt in a week   However the last week her anxiety is much worse has delusions of paranoia and plots of others trying  to kill her and  is suicidal ,asking to be admitted to a hospital     The following portions of the patient's history were reviewed and updated as appropriate: allergies, current medications, past family history, past medical history, past social history, past surgical history and problem list.    Review of Systems  Pertinent items are noted in HPI.    Review of Systems   Review of Systems - General ROS: negative for - fatigue, malaise, night sweats or weight change      Objective:      BP 120/92 mmHg   Pulse 113   Ht 5' 6.75 (1.695 m)   Wt 233 lb (105.688 kg)   BMI 36.79 kg/m2   SpO2 97%    General appearance : Nervous tearful , hyperventillating , in emotional distress                                                                                        Assessment:     1. Severe anxiety with panic , with paranoid delusions and suicidal , with h/o bipolar not on treatment Referred to [facility] , intake office called , and mother will take her there today , mother has to verify insurance before she takes pt there

## 2015-07-01 NOTE — Unmapped (Signed)
[facility]   Inpatient Shift Assessment    Name: Jocelyn Schaefer  MRN: [id number]  Admission date: 07/01/2015    Jocelyn Schaefer arrived on the unit @ 20:40. Pt denies any pain and discomfort at this time. She was oriented to the unit and shown to her room for a skin and safety assessment. She signed her medication consent form and belonging sheet. She denies feeling actively suicidal or having any homicidal ideation and was able to agree to come to staff if feeling unsafe or anything changes.?? SRA 28 indicating.   Patient  accompanied by Intake Jocelyn Schaefer, ASC, RN. Patient is pleasant and cooperative with admission process. Jocelyn Schaefer is  on a voluntary admission.  She is able to verbalize that she will remain safe at this time. Therefore, they may have their own clothes, standard linens and all silverware with meals. Jocelyn Schaefer was offered to eat, showered and to take medication. Staggared checks maintained per staff @ least every 15 minutes per unit protocol. .      Vitals:     Temp: 98.2 ??F (36.8 ??C)  Temp Source: Oral  Heart Rate: 91  Resp: 20  BP: 122/77 mmHg  BP Location: Left arm  BP Method: Automatic  Patient Position: Sitting  SpO2: 98 %  O2 Device: None (Room air)  Height: 5' 8 (172.7 cm)  Weight: (!) 233 lb (105.688 kg)  Weight Source: Stated Weight  BMI (Calculated): 35.5      Pain/ Pain Reassessment:     Pain Score:   7  Pain Location: Back  Pain Descriptors: Tightness;Sharp  Pain Intervention(s): Medication (See eMAR)         Intake:            Output:            POCT Glucose:            Suicide Risk Assessmnet:     Suicidal Thoughts: Frequent or passive with vague plan with intent  Suicide Plan: Vague but realistic plan  - available means  Suicidal Intent: None Present  Ability to Engage for Safety Planning: Able and willing  History of Suicide Attempts: None Present  Lethality of Past-Self-Injurious Behavior (if more than one , score most sever): Superficial or non-suicidal  self-injurious act without injury requiring treatment  Depresssion: Moderate, moody, sad  Anxiety: High, frequent episodes of intense anxiety PTSD, or panic symptoms  Psychosis: Paranoid delusions or ideas of reference, with poor reality testing  Alcohol/Drug Use: Infrequent or past use only, no excessive use  Anger/Impulsivity: Low, rare outbursts, few impulsive acts  Hopelessness/ Overwhelmed Feelings: Moderate, frequent, but not excessive feelings  Medical Factors: Chronic and/or severly debilitation  Resources of Support: Limited family/social resources  Situational Stressors: 1-2 stressors  Patient Score: 28  Observation Level: 15 minute checks  Assigned Risk: Moderate Risk        Mental Status Exam:     Apparent Age: Appears Actual Age  Hygiene/Grooming: Well Groomed  General Attitude: Cooperative  Motor Activity: Restlessness  Eye Contact: Appropriate  Facial Expression: Anxious  Patient Behaviors: Appropriate for age  Impulsivity: Occasional  Speech Pattern: Within Defined Limits  Mood: Anxious  Affect: Blunted  Affect congruent with mood: Yes  Content: Magical thinking;Delusions  Delusions: Paranoid  Perception: Hallucinations  Hallucination: Auditory  Thought content appropriate to situation: Yes  Danger to Others (WDL): Within Defined Limits  Thought process: Appropriate;Rapid  Memory Impairment: None  Cognition: Ability to abstract  Orientation Level: Oriented X4  Intelligence: Above average  Insight: Impaired  Judgement: Impaired  Appetite Change: Increased  Do you have any sleep concerns?: Difficulty falling asleep/insomnia  Libido: Normal        Edmonson Fall Risk     Age: Less than 50  Mental Status: Fully Alert/ Oriented at all times  Medication: No medications  Psych Diagnosis: Bipolar/ schizoafective disorder  Ambulation/ Balance: Independent/ Steady gait/ Immobile  Nutrition: No apparent abnormalities with appetite  Sleep Disturbance: Report of sleep disturbance by patient, family or  staff  History of Falls: No history of falls  Secondary Diagnosis: No medical problems  Edmonson Fall Risk Score: 48             Patient Checks:     Interventions: Call bell within reach  Visual Checks: Standard Q15  Arm Bands On: Allergies, ID  Patient Checked for Contraband: Belongings checked        Safety:     Recent Psychological Experiences: Other (Comment);Loss (Comment)  Self Injurious Thoughts: Intent and plan for self injurious actions (Comment)  Self Injurious Behaviors: None observed  Family Suicide History: No  Thoughts of Harming Others: Denies  Do you have access to weapons in the home?: No  History of Aggression or Violence: No history  Current Thoughts of Aggression: No  Does Patient's Family Have a History of Aggression or Violence?: No  Pt/Family Hx of Legal problems d/t aggression: No  Elopement risk?: No  Restraint Contraindications: None  If restrained/secluded, notify someone?: Yes  Name of person to notify if restrained/secluded: sUSAN Riccetti    [phone]  Methods to Calm Down: Watch TV;Talk with staff        DASA     Irritablity: No  Verbal Threats: No  Impulsivity: No  Negative attitude: No  Unwillingness to follow direction: No  Sensitivity to perceived provocation: No  Easily angered when request denied: No  Total Score - DASA: 0      Withdrawl Symptoms:     Has Patient Abused Substances in the Past 7 Days?: Yes  Is Patient Showing Signs of Withdrawal?: No      Hygiene:     Bath/Shower: Independent  Level of Assistance: Independent      Nutrition Screen:     Feeding: Able to feed self  Diet Type: Regular  Appetite: Good        Deeann Dowse, RN  07/01/2015  9:26 PM

## 2015-07-01 NOTE — Unmapped (Signed)
Security search done. Escorted to adult [place]

## 2015-07-02 ENCOUNTER — Inpatient Hospital Stay
Admit: 2015-07-02 | Discharge: 2015-07-07 | Disposition: A | Discharge status: Home / Self Care | Payer: PRIVATE HEALTH INSURANCE | Source: Ambulatory Visit | Attending: Psychiatry | Admitting: Psychiatry

## 2015-07-02 LAB — BASIC METABOLIC PANEL
Anion Gap: 10 mmol/L (ref 3–16)
BUN: 10 mg/dL (ref 7–25)
CO2: 27 mmol/L (ref 21–33)
Calcium: 9.3 mg/dL (ref 8.6–10.3)
Chloride: 105 mmol/L (ref 98–110)
Creatinine: 0.68 mg/dL (ref 0.60–1.30)
Glucose: 79 mg/dL (ref 70–100)
Osmolality, Calculated: 292 mOsm/kg (ref 278–305)
Potassium: 3.9 mmol/L (ref 3.5–5.3)
Sodium: 142 mmol/L (ref 133–146)
eGFR AA CKD-EPI: >90 See note.
eGFR NONAA CKD-EPI: >90 See note.

## 2015-07-02 LAB — CBC
Hematocrit: 40.6 % (ref 35.0–45.0)
Hemoglobin: 13.2 g/dL (ref 11.7–15.5)
MCH: 28 pg (ref 27.0–33.0)
MCHC: 32.5 g/dL (ref 32.0–36.0)
MCV: 86.2 fL (ref 80.0–100.0)
MPV: 10.6 fL (ref 7.5–11.5)
Platelets: 213 10*3/uL (ref 140–400)
RBC: 4.71 10*6/uL (ref 3.80–5.10)
RDW: 13.5 % (ref 11.0–15.0)
WBC: 11.3 10*3/uL (ref 3.8–10.8)

## 2015-07-02 LAB — URINE DRUG SCREEN WITHOUT CONFIRMATION, STAT
Amphetamine, 500 ng/mL Cutoff: NEGATIVE
Barbiturates UR, 300  ng/mL Cutoff: NEGATIVE
Benzodiazepines UR, 300 ng/mL Cutoff: NEGATIVE
Buprenorphine, 5 ng/mL Cutoff: NEGATIVE
Cocaine UR, 300 ng/mL Cutoff: NEGATIVE
Fentanyl, 2 ng/mL Cutoff: NEGATIVE
Methadone, UR, 300 ng/mL Cutoff: NEGATIVE
Opiates UR, 300 ng/mL Cutoff: NEGATIVE
Oxycodone, 100 ng/mL Cutoff: NEGATIVE
THC UR, 50 ng/mL Cutoff: NEGATIVE
Tricyclic Antidepressants, 300 ng/mL Cutoff: NEGATIVE

## 2015-07-02 MED ORDER — QUEtiapine (SEROQUEL) tablet 50 mg
50 | Freq: Every evening | ORAL | Status: AC
Start: 2015-07-02 — End: 2015-07-04
  Administered 2015-07-03 – 2015-07-04 (×2): 50 mg via ORAL

## 2015-07-02 MED ORDER — clonazePAM (klonoPIN) tablet 0.5 mg
0.5 | Freq: Two times a day (BID) | ORAL | Status: AC
Start: 2015-07-02 — End: 2015-07-07
  Administered 2015-07-03 – 2015-07-07 (×10): 0.5 mg via ORAL

## 2015-07-02 MED ORDER — sertraline (ZOLOFT) tablet 25 mg
25 | Freq: Every day | ORAL | Status: AC
Start: 2015-07-02 — End: 2015-07-04
  Administered 2015-07-02 – 2015-07-04 (×3): 25 mg via ORAL

## 2015-07-02 MED FILL — CLONAZEPAM 0.5 MG TABLET: 0.5 0.5 MG | ORAL | Qty: 1

## 2015-07-02 MED FILL — SERTRALINE 25 MG TABLET: 25 25 MG | ORAL | Qty: 1

## 2015-07-02 MED FILL — QUETIAPINE 50 MG TABLET: 50 50 MG | ORAL | Qty: 1

## 2015-07-02 MED FILL — NICOTINE 21 MG/24 HR DAILY TRANSDERMAL PATCH: 21 21 mg/24 hr | TRANSDERMAL | Qty: 1

## 2015-07-02 NOTE — Unmapped (Signed)
Problem: Disposition  Intervention: Additional Intervention #3 (SW)  Social worker will communicate with support person and outpatient providers to collaborate on discharge planning.      Responsible staff: [employer], LISW   SW called and left a message for pt's PCP Dr. Ronnie Doss, MD notifying her of pt's admission and providing a phone number where I can be reached if she has any questions.

## 2015-07-02 NOTE — Unmapped (Signed)
[facility] [place]     Internist Admission History and Physical    Name: Jocelyn Schaefer  DOB: [birth year] MRN: [id number]    Admit Date: 07/01/2015    Admitting Physician: Dalene Carrow, MD   Patient's PCP: Frederick Peers, MD    Chief Complaint: Suicidal meltdown.    History of Present Illness:    This patient is a pleasant 20 y.o. female who has been admitted to the [facility] [place]. She presents with increased depression, anxiety and suicidal ideation.?? She reports she has struggled with anxiety since age 2 or 73, though has noted a significant increase in symptoms over the past year without identifiable trigger.?? She fears the world ending, states that this fear is paralyzing and thought that she should end her own life so that she can be in control of her life and death.?? She reports she has had longstanding fear of illness since age 59 or 52, but subsequently increased significantly after her grandmother, that she was very close to, died after she was in high school, is constantly in fear that she will make a bad impression or someone will kill her.?? Fears that people are following her. Believes that other people can hear her thoughts and notes this began last year and has progressively increased over time. Historically has been tried on different medications, but has poor followup and additionally experiences symptoms, which she attributes to fears that she is going to die so she stops the medications.?? She reports ongoing  passive suicidal ideation without current plan or intent. She struggled with suicidal ideation since age 8.?? She has had no suicide attempts. She does have a history of self-harm behaviors, originally would cut self when she was younger, but has not cut within the past year.?? However, she has been burning herself with cigarettes?? over the past year, most recent was 3 weeks ago. Patient reports that she was originally diagnosed with attention deficit hyperactivity  disorder, but the diagnosis was changed to bipolar disorder while she was in high school. She notes that typically she struggles with depressed mood.?? Manic symptoms:?? Usually last a week to two weeks and typically occur once per month.?? Has ongoing struggles with interpersonal relationships.?? Often becomes jealous of friends and fears abandonment.    Allergies:  Allergies   Allergen Reactions   ??? Prozac [Fluoxetine] Other (See Comments)     GI upset and anxiety was worse   ??? Spironolactone      Indigestion, muscle aches , dizziness ,        Past Medical History:  Past Medical History   Diagnosis Date   ??? Depression    ??? Anxiety    ??? Bipolar disorder    ??? Asthma    ??? Borderline personality disorder        Past Surgical History:  Past Surgical History   Procedure Laterality Date   ??? Tonsillectomy  2001   ??? Tympanostomy tube placement  2001       Medications:   Current Facility-Administered Medications   Medication Dose Frequency Provider Last Dose   ??? acetaminophen  650 mg Q4H PRN Jolomi Ikomi     ??? clonazePAM  0.5 mg BID Marguerita Beards, CNP     ??? nicotine  1 patch Daily 0900 Jolomi Ikomi 1 patch at 07/02/15 0847   ??? olanzapine zydis  5 mg Q4H PRN Jolomi Ikomi     ??? quetiapine  50 mg Nightly (2100) Catarina Hartshorn  Dalbert Batman, CNP     ??? sertraline  25 mg Daily 0900 Marguerita Beards, CNP 25 mg at 07/02/15 1637   ??? ziprasidone  20 mg UD PRN Jolomi Ikomi         Family Medical History:  Family History   Problem Relation Age of Onset   ??? Bipolar disorder Maternal Aunt    ??? Bipolar disorder Mother    ??? Cushing syndrome Mother    ??? Anxiety disorder Mother    ??? Clotting disorder Father    ??? Cancer Paternal Uncle      liver   ??? Kidney disease Maternal Grandmother    ??? Diabetes Maternal Grandmother    ??? Cancer Maternal Grandmother    ??? Clotting disorder Paternal Grandfather    ??? Diabetes Maternal Uncle        Social History:  Social History     Social History   ??? Marital Status: Single     Spouse Name: N/A   ??? Number of Children:  N/A   ??? Years of Education: N/A     Social History Main Topics   ??? Smoking status: Current Some Day Smoker -- 0.25 packs/day for 3 years   ??? Smokeless tobacco: Never Used   ??? Alcohol Use: 0.6 oz/week     1 Standard drinks or equivalent per week      Comment: last use two weeks ago- unsure of amt.   ??? Drug Use: Yes     Special: Marijuana      Comment: UA tox screen positive for Marijuana.- last use couple days ago   ??? Sexual Activity:     Partners: Male     Pharmacist, hospital Protection: None     Other Topics Concern   ??? Caffeine Use Yes   ??? Occupational Exposure No   ??? Exercise No   ??? Seat Belt Yes     Social History Narrative       Review of Systems:  Denies any fever, chills, blurry vision, chest pain, shortness of breath, nausea, vomiting, abdominal pain urinary symptoms or bowel movement changes.    Physical Exam: Vital signs reviewed if available     Filed Vitals:    07/02/15 0825   BP: 133/74   Pulse: 95   Temp: 98.1 ??F (36.7 ??C)   Resp: 16   SpO2: 100%       General Appearance:    Alert, cooperative, no distress, appears stated age   Head:    Normocephalic, without obvious abnormality, atraumatic   Eyes:    PERRL, conjunctiva/corneas clear, EOM's intact, both eyes   Throat:   Lips, mucosa, and tongue normal; teeth and gums normal   Neck:   Supple, symmetrical, trachea midline, no adenopathy;     thyroid:  no enlargement/tenderness/nodules; no carotid    bruit or JVD   Back:     Symmetric, no curvature, ROM normal, no CVA tenderness   Lungs:     Clear to auscultation bilaterally, respirations unlabored    Heart:    Regular rate and rhythm, S1 and S2 normal    Abdomen:     Soft, non-tender, bowel sounds active, no palpable masses   Extremities:   No peripheral edema   Pulses:   Neurologic  Skin:   2+ and symmetric all extremities    No focal motor or sensory deficits    No visible lacerations or scars         Laboratory Studies: Reviewed, if  performed and available     Recent Labs      07/02/15   0544   WBC  11.3*    HGB  13.2   HCT  40.6   PLT  213      Recent Labs      07/02/15   0544   NA  142   K  3.9   CL  105   CO2  27   BUN  10   CREATININE  0.68   GLUCOSE  79     No results for input(s): NITRITE, COLORU, PHUR, WBCUA, RBCUA, MUCUS, TRICHOMONAS, YEAST, BACTERIA, CLARITYU, SPECGRAV, LEUKOCYTESUR, UROBILINOGEN, BILIRUBINUR, BLOODU, GLUCOSEU, KETONESU, AMORPHOUS in the last 72 hours.    Invalid input(s): LABCAST     Assessment / Plan:            This patient is a pleasant 20 y.o. female who has been admitted to the [facility] for further evaluation and management of this patient's mental illness.    1.?? Bipolar disorder.  2.?? Anxiety disorder.  3.?? History of attention deficit hyperactivity disorder.  4.?? Rule out obsessive compulsive disorder.  5.?? Borderline personality disorder.     Treatment and Medications as per Psychiatry.    Total time spent on the admission of this patient was greater than 35 minutes, including time spent interviewing and  examining the patient, reviewing the medical data, and completing the electronic medical record.    Electronically signed:  Dalene Carrow, MD  07/02/2015  [facility] [place]
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
## 2015-07-02 NOTE — Unmapped (Signed)
Patient had a flat affect this shift.  Her mood was anxious and sad.  She is a mild risk, as patient remained withdrawn this shift, with a low mood.  Patient attended the Community Group only.  She identified her goal for the day, which was to meet with the members of her treatment team.  Patient accomplished this goal.  She refused all other programming, to rest.  Patient is minimally social with staff and peers, avoiding eye contact.  She ate 100% of her breakfast and 75% of her lunch.

## 2015-07-02 NOTE — Unmapped (Signed)
Problem: Coping/Self Expression Impairment  Goal: Increase coping skills  Intervention: Provide leisure coping skills groups  Provide daily RT groups to encourage participation in appropriate leisure coping skills.    Responsible staff: Karen Chafe, CTRS  Name: Jocelyn Schaefer    Date: 07/02/2015    Time: 12:35 PM    Group Type: Recreation Therapy    Group Name: Art Therapy - Past & Future    Group Objective: Purpose of this group was to allow participants to draw significant moments in their past and what they hope for in the future. Pt then drew pictures or wrote words to represent each category. Conversation centered positive and negative aspects of both and how the hope is to have more positive aspects in our life in the future.    Attendance: Did not attend    Interaction: Did not interact    Mood/Affect: Unable to assess    Participation: Pt declined to participate in activity.       Jocelyn Schaefer, CTRS

## 2015-07-02 NOTE — Unmapped (Signed)
[facility] called and informed the office that the patient was admitted and if the doctor had any questions to give them a call.

## 2015-07-02 NOTE — Unmapped (Signed)
[facility]   Inpatient Shift Assessment    Name: Jocelyn Schaefer  MRN: [id number]  Admission date: 07/01/2015    Jocelyn Schaefer presents a depressed mood with a flat affect during this shift. Pt is pleasant and cooperative with staff. Pt was visible in the milieu, coloring as a coping skill. Pt consumed 75% of meal. Pt did not have any visitors during this shift. Pt attended selective unit programming. Pt rates her anxiety a 8-9 out of 10. Pt rates her depression a 3 out of 10. Pt endorses SI thoughts, with no plan. Pt denies A/V hallucinations. Pt verbalizes that she will come to staff for assistance when needed. Will continue to monitor.  I have reviewed the documentation of Brianna, MHT, and agree with it. There are no additions, deletions, or changes to be made. Medication compliant this shift. PRN medication not given. Jocelyn Schaefer is able to verbalize that she will remain safe at this time. Therefore, she may have their own clothes, standard linens and all silverware with meals. SRA =20     Vitals:     Temp: 98.1 ??F (36.7 ??C)  Temp Source: Oral  Heart Rate: 95  Resp: 16  BP: 133/74 mmHg  BP Location: Right arm  BP Method: Automatic  Patient Position: Sitting  SpO2: 100 %  O2 Device: None (Room air)  Height: 5' 8 (172.7 cm)  Weight: (!) 233 lb (105.688 kg)  Weight Source: Stated Weight  BMI (Calculated): 35.5      Pain/ Pain Reassessment:     Pain Score: 0-No pain         Intake:            Output:            POCT Glucose:            Suicide Risk Assessmnet:     Suicidal Thoughts: Infrequent or passive thoughts  Suicide Plan: None Present  Suicidal Intent: None Present  Ability to Engage for Safety Planning: Able and willing  History of Suicide Attempts: None Present  Lethality of Past-Self-Injurious Behavior (if more than one , score most sever): Superficial or non-suicidal self-injurious act without injury requiring treatment  Depresssion: Moderate, moody, sad  Anxiety: Moderate, infrequent episodes  of intense anxiety  Psychosis: Paranoid delusions or ideas of reference, with poor reality testing  Alcohol/Drug Use: Infrequent or past use only, no excessive use  Anger/Impulsivity: None  Hopelessness/ Overwhelmed Feelings: Moderate, frequent, but not excessive feelings  Medical Factors: Chronic and/or severly debilitation  Resources of Support: Limited family/social resources  Situational Stressors: 1-2 stressors  Patient Score: 20  Observation Level: 15 minute checks  Assigned Risk: Moderate Risk        Mental Status Exam:     Apparent Age: Appears Actual Age  Hygiene/Grooming: Disheveled  General Attitude: Cooperative  Eye Contact: Brief  Facial Expression: Anxious  Patient Behaviors: Appropriate for situation  Impulsivity: Normal  Speech Pattern: Within Defined Limits  Mood: Depressed  Affect: Flat  Affect congruent with mood: Yes  Content: Within Defined Limits  Delusions: Within Defined Limits  Perception: Appropriate  Hallucination: None  Thought content appropriate to situation: Yes  Danger to Others (WDL): Within Defined Limits  Thought process: Appropriate              Edmonson Fall Risk     Age: Less than 50  Mental Status: Fully Alert/ Oriented at all times  Medication: No medications  Psych Diagnosis: Bipolar/  schizoafective disorder  Ambulation/ Balance: Independent/ Steady gait/ Immobile  Nutrition: No apparent abnormalities with appetite  Sleep Disturbance: Report of sleep disturbance by patient, family or staff  History of Falls: No history of falls  Secondary Diagnosis: No medical problems  Edmonson Fall Risk Score: 60             Patient Checks:     Interventions: Call bell within reach, ID band on  Visual Checks: Standard Q15  Arm Bands On: ID, Allergies  Patient Checked for Contraband: Belongings checked        Safety:     Self Injurious Thoughts: Without plan for self injurious actions  Self Injurious Behaviors: None observed  Thoughts of Harming Others: Denies  Current Thoughts of  Aggression: No  Elopement risk?: No  Methods to Calm Down: Change of environment;Relaxation Technique (specify) (coloring)        DASA     Irritablity: No  Verbal Threats: No  Impulsivity: No  Negative attitude: No  Unwillingness to follow direction: No  Sensitivity to perceived provocation: No  Easily angered when request denied: No  Total Score - DASA: 0      Withdrawl Symptoms:            Hygiene:            Nutrition Screen:     Feeding: Able to feed self  Diet Type: Regular  Appetite: Good        Deeann Dowse, RN  07/02/2015  9:42 PM

## 2015-07-02 NOTE — Unmapped (Signed)
[place]  [facility]  Initial Psychiatric Assessment [phone]        Assessment/Plan:     20 y/o F with h/o BAD, Anxiety, BPD with increased anxiety, delusions, paranoia and SI.  Admitted voluntarily to [facility] [place].    Plan:  - start zoloft 25mg /day for anxiety/mood, titrate as tolerated  - start seroquel 50mg  q hs for mood stability/anxiety   - start klonopin 0.5mg  bid for anxiety/panic, particularly while starting new medications as historically stops medications d/t perceived se's and fears about catastrophic illness    Dispo:  - TBD depending upon safety, med tolerability and symptom management        Marguerita Beards, CNP  07/02/2015
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
## 2015-07-02 NOTE — Nursing Note (Signed)
 Jocelyn Schaefer appeared to have slept 6.75 hours at the time of this note. Pt had no needs this shift. She was compliant with morning labs. 15 minute safety checks in place. Will continue to monitor.

## 2015-07-02 NOTE — Unmapped (Signed)
Problem: Disposition  Intervention: Child psychotherapist will provide group therapy  that encourages increased coping skills, insight building and discharge preparedness.    Responsible staff: Vear Clock, LISW   Group Note    Patient Name: Jocelyn Schaefer    Date: 07/02/15      Time: 1:00pm                Group Type: Social Work    Group Name: Positive Psychology    Group Objective: Learn about Positive Psychology techniques and identify personal strengths and how they can be used to improve personal well-being.     Attendance: Did not attend    Interactions: Did not attend    Mood/Affect: Unable to assess    Patient Participation: Patient did not attend group.     Randye Lobo, MSW, LSW

## 2015-07-02 NOTE — Unmapped (Signed)
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      Prior Function  Prior Function  Level of Independence: Independent  Lives With: Family (Mom, StepFather, Brother)  Actor Help From: Family, Friend(s) (Mom)  ADL Assistance: Independent  Homemaking/IADL Assistance: Independent  Vocational: Part time employment      Vision  Current Vision: Wears glasses all the time    Hearing  Hearing: No Deficits    Cognition  Overall Cognitive Status: Within Functional Limits  Orientation Level: Oriented X4    Social Domain  Overall Emotional Status: Impaired  Emotional Barriers: Anxious, Flat affect    Community Domain  Overall Community Domain Status: Within Functional Limits    Leisure Domain  Overall Leisure Domain Status: Impaired  Leisure Barriers: Withdrawn from positive leisure activities  Leisure Interest: watching TV, craft/arts, computers, reading/writing (Netflix, draw, blog)    Pt cooperative during assessment. Pt stated she was admitted for paranoia, anxiety and delusions. Pt stated she has had previous admission to [facility] with most recent September 2015. Pt lives with family and works part time. She wears glasses. Pt has some leisure interests. Pt would benefit from Recreation Therapy groups to gain healthy coping skills.

## 2015-07-02 NOTE — Unmapped (Signed)
Problem: Coping/Self Expression Impairment  Goal: Increase coping skills  Pt stated, I want to figure out what???s wrong and find meds to manage my feelings. To get better. Pt will attended at least 1 Recreation Therapy group per day to gain healthy coping skills to help her get better.   Intervention: Provide leisure coping skills groups  Provide daily RT groups to encourage participation in appropriate leisure coping skills.    Responsible staff: Karen Chafe, CTRS  Name: Braniyah Besse    Date: 07/02/2015    Time: 4:14 PM    Group Type: Recreation Therapy    Group Name: Phase 10    Group Objective: Purpose of this group was to learn a new leisure pursuit, encourage social interactions and to work on sequencing while playing a card game.      Attendance: Did not attend    Interaction: Did not interact    Mood/Affect: Unable to assess    Participation: Pt declined to participate in activity.       Taraji Mungo [place], CTRS

## 2015-07-03 MED FILL — NICOTINE 21 MG/24 HR DAILY TRANSDERMAL PATCH: 21 21 mg/24 hr | TRANSDERMAL | Qty: 1

## 2015-07-03 MED FILL — QUETIAPINE 50 MG TABLET: 50 50 MG | ORAL | Qty: 1

## 2015-07-03 MED FILL — CLONAZEPAM 0.5 MG TABLET: 0.5 0.5 MG | ORAL | Qty: 1

## 2015-07-03 MED FILL — SERTRALINE 25 MG TABLET: 25 25 MG | ORAL | Qty: 1

## 2015-07-03 MED FILL — OLANZAPINE 5 MG DISINTEGRATING TABLET: 5 5 MG | ORAL | Qty: 1

## 2015-07-03 NOTE — Unmapped (Signed)
Problem: Coping/Self Expression Impairment  Goal: Increase coping skills  Pt stated, I want to figure out what???s wrong and find meds to manage my feelings. To get better. Pt will attended at least 1 Recreation Therapy group per day to gain healthy coping skills to help her get better.   Intervention: Provide leisure coping skills groups  Provide daily RT groups to encourage participation in appropriate leisure coping skills.    Responsible staff: Karen Chafe, CTRS  Name: Jocelyn Schaefer    Date: 07/03/2015    Time: 12:48 PM    Group Type: Recreation Therapy    Group Name: Relaxation/Guided Imagery    Group Objective: Purpose of this group was to learn to use guided imagery and breathing as a way to relax.  Discussions centered on identifying what helps a person relax and what images a person regards as peaceful and serene.      Attendance: Did not attend    Interaction: Did not interact    Mood/Affect: Unable to assess    Participation: Pt declined to participate in activity.       [name] [place], CTRS

## 2015-07-03 NOTE — Unmapped (Signed)
Pt presents with slightly anxious, depressed mood and flat affect. Pt briefly visible in milieu for meals only. Pt declined all unit programming. Pt observed withdrawn most of shift reading or resting in room. Pt expressed during checks that she does feel safe coming and talking to staff if need be. Pt minimally social with staff mainly during checks. Pt appetite good, ate 100% breakfast and 75% lunch.

## 2015-07-03 NOTE — Unmapped (Signed)
[facility]   Inpatient Shift Assessment    Name: Jocelyn Schaefer  MRN: [id number]  Admission date: 07/01/2015    Jocelyn Schaefer was observed lying in bed reading a book at the beginning of shift.  Patient states her anxiety/depression were 3/10. Patient remained isolated in room for remainder of evening.  Patient had family visitors, appeared to go well.  Patient denies pain.  Patient ate 75% of dinner.  Patient did not attend/engage in groups.  Patient reports increase in anxiety, 8/10, Zydis 5mg  was given @ 2209.  Patient denies SI and contracts for safety.  Patient agrees to notify staff if anxiety/depression continues to increase.  Jocelyn Schaefer is able to verbalize that she will remain safe at this time. Therefore, she may have her own clothes, standard linens and all silverware with meals. SRA = 17      Vitals:     Temp: 98.2 ??F (36.8 ??C)  Temp Source: Oral  Heart Rate: 98  Resp: 16  BP: 125/79 mmHg  BP Location: Right arm  BP Method: Automatic  Patient Position: Sitting  SpO2: 100 %  O2 Device: None (Room air)  Height: 5' 8 (172.7 cm)  Weight: (!) 233 lb (105.688 kg)  Weight Source: Stated Weight  BMI (Calculated): 35.5      Pain/ Pain Reassessment:     Pain Score: 0-No pain         Intake:            Output:            POCT Glucose:            Suicide Risk Assessmnet:     Suicidal Thoughts: Infrequent or passive thoughts  Suicide Plan: None Present  Suicidal Intent: None Present  Ability to Engage for Safety Planning: Able and willing  History of Suicide Attempts: None Present  Lethality of Past-Self-Injurious Behavior (if more than one , score most sever): Superficial or non-suicidal self-injurious act without injury requiring treatment  Depresssion: Mild, feels slightly down  Anxiety: Low, denies episode of intense anxiety  Psychosis: Paranoid delusions or ideas of reference, with poor reality testing  Alcohol/Drug Use: Infrequent or past use only, no excessive use  Anger/Impulsivity:  None  Hopelessness/ Overwhelmed Feelings: Moderate, frequent, but not excessive feelings  Medical Factors: Chronic and/or severly debilitation  Resources of Support: Limited family/social resources  Situational Stressors: 1-2 stressors  Patient Score: 17  Observation Level: 15 minute checks  Assigned Risk: Moderate Risk        Mental Status Exam:     Apparent Age: Appears Actual Age  Hygiene/Grooming: Disheveled  General Attitude: Cooperative;Uninterested  Motor Activity: Freedom of movement  Eye Contact: Brief  Facial Expression: Anxious  Patient Behaviors: Cooperative;Withdrawn  Impulsivity: Normal  Speech Pattern: Within Defined Limits  Mood: Anxious;Depressed  Affect: Flat  Affect congruent with mood: Yes  Content: Within Defined Limits  Delusions: Within Defined Limits  Perception: Appropriate  Hallucination: None  Thought content appropriate to situation: Yes  Danger to Others (WDL): Within Defined Limits  Thought process: Appropriate  Insight: Impaired  Judgement: Impaired           Edmonson Fall Risk     Age: Less than 50  Mental Status: Fully Alert/ Oriented at all times  Medication: No medications  Psych Diagnosis: Bipolar/ schizoafective disorder  Ambulation/ Balance: Independent/ Steady gait/ Immobile  Nutrition: No apparent abnormalities with appetite  Sleep Disturbance: Report of sleep disturbance by patient, family  or staff  History of Falls: No history of falls  Secondary Diagnosis: No medical problems  Edmonson Fall Risk Score: 93             Patient Checks:     Interventions: ID band on  Visual Checks: Standard Q15  Arm Bands On: ID, Allergies  Patient Checked for Contraband: Belongings checked        Safety:     Self Injurious Thoughts: Denies  Self Injurious Behaviors: None observed  Thoughts of Harming Others: Denies  Current Thoughts of Aggression: No  Elopement risk?: No  Methods to Calm Down: Quiet time in room;Reading;Change of environment        DASA     Irritablity: No  Verbal Threats:  No  Impulsivity: No  Negative attitude: No  Unwillingness to follow direction: No  Sensitivity to perceived provocation: No  Easily angered when request denied: No  Total Score - DASA: 0      Withdrawl Symptoms:            Hygiene:            Nutrition Screen:              Jocelyn Poe, RN  07/03/2015  10:12 PM

## 2015-07-03 NOTE — Unmapped (Signed)
Problem: Disposition  Intervention: Additional Intervention #3 (SW)  Social worker will communicate with support person and outpatient providers to collaborate on discharge planning.      Responsible staff: Vear Clock, LISW   SW spoke with mother to provide update.

## 2015-07-03 NOTE — Unmapped (Signed)
Problem: Coping/Self Expression Impairment  Goal: Increase coping skills  Pt stated, I want to figure out what???s wrong and find meds to manage my feelings. To get better. Pt will attended at least 1 Recreation Therapy group per day to gain healthy coping skills to help her get better.   Intervention: Provide leisure coping skills groups  Provide daily RT groups to encourage participation in appropriate leisure coping skills.    Responsible staff: Karen Chafe, CTRS  Name: Michalina Calbert    Date: 07/03/2015    Time: 4:17 PM    Group Type: Recreation Therapy    Group Name: Rummikub    Group Objective: Purpose of this group was to learn a new leisure pursuit while increasing socialization opportunities.  Participants learned to play a game similar to 500 [address] using a slightly different strategy.      Attendance: Did not attend    Interaction: Did not interact    Mood/Affect: Unable to assess    Participation: Pt declined to participate in activity.       [place] Fant Hodges, CTRS

## 2015-07-03 NOTE — Unmapped (Signed)
Problem: Disposition  Goal: Disposition Goal  Long Term Goal: Patient will have a successful discharge as evidenced by verbalizing a commitment to ongoing care and having appropriate follow up appointments in place to support her ongoing recovery in the community.   I didn???t think 30 minutes with a therapist was going to be enough. I wanted to come here for a couple of days, try a medication and then follow up.   Outcome: Progressing  72 hour treatment plan update  SW met with pt to discuss progress and discharge plans.  Pt states that she feels like getting on her medications is the first step to getting well and wants to follow up with meds and therapy.  Pt feels too that she would like some resources within the LGBT community to increase her overall support.  SW discussed how these things would be provided to pt leading up to discharge.  Pt reviewed and signed her treatment plan update.

## 2015-07-03 NOTE — Nursing Note (Signed)
 Jocelyn Schaefer appeared to have slept 7 hours at the time of this note. Pt had no needs this shift. 15 minute safety checks in place. Will continue to monitor.

## 2015-07-03 NOTE — Unmapped (Signed)
[facility]  Progress Note    Name: Jocelyn Schaefer  MRN: [id number]  Date: 07/03/2015   Time: 11:05 AM  Total Duration: 20 mins  Psychotherapy duration: 16 mins    S:  In bed, wakes easily to name.  Tired. Slept well.  Fell asleep within 15 mins of taking hs meds.  Mood ok.  Depression 3/10 (10 high), anxiety 7/10, fears other people dying. Less worried that i should be worrying about something.  Ongoing paranoia, fears others talking about her.  Doesn't like to be looked at, avoiding socialization/groups, Passive SI without current plan/intent.  - hi/avh.    NR: flat, anxious, sad, depressed, withdrawn, minimally social, few groups, + si without plan, coloring as coping, slept 7 hours    Objective:      Vital signs in last 24 hours:    Filed Vitals:    07/03/15 0900   BP: 125/79   Pulse: 98   Temp: 98.2 ??F (36.8 ??C)   Resp: 16   SpO2: 100%         Labs:    Available labs reviewed     Scheduled Meds:    Current Facility-Administered Medications   Medication Dose Route Frequency Provider Last Rate Last Dose   ??? acetaminophen (TYLENOL) tablet 650 mg  650 mg Oral Q4H PRN Jolomi Ikomi       ??? clonazePAM (klonoPIN) tablet 0.5 mg  0.5 mg Oral BID Marguerita Beards, CNP   0.5 mg at 07/03/15 5409   ??? nicotine (NICODERM CQ) 21 mg/24 hr 1 patch  1 patch Transdermal Daily 0900 Jolomi Ikomi   1 patch at 07/03/15 8119   ??? olanzapine zydis (ZYPREXA) disintegrating tablet 5 mg  5 mg Oral Q4H PRN Jolomi Ikomi       ??? QUEtiapine (SEROQUEL) tablet 50 mg  50 mg Oral Nightly (2100) Marguerita Beards, CNP   50 mg at 07/02/15 2050   ??? sertraline (ZOLOFT) tablet 25 mg  25 mg Oral Daily 0900 Marguerita Beards, CNP   25 mg at 07/03/15 1478   ??? ziprasidone (GEODON) injection 20 mg  20 mg Intramuscular UD PRN Jolomi Ikomi             Mental Status Evaluation:  General     Development :normal    Body Habitus: overweight    Grooming/Hygiene : disheveled     Demeanor: polite and cooperative    Eye Contact:   appropriate  Speech   Rate: Normal   Volume: Normal   Articulation:Normal   Quality: Normal     Motor   Strength/Tone: normal   Atrophy:none   Abnormal Movements: none   Station:normal     Gait: normal  Mood/ Affect   Mood:  depressed and anxious    Affect - Range: normal      - Reactivity: normal      - Appropriateness: appropriate to mood and/or situation  Thought    Content: obsessions, delusions and anxious ruminations   Process: normal   Associations: normal   Physical and Psychological Reality Testing : normal  Cognitive   Level of Alertness: sedated   Orientation to: time, place, person and situation   Alert and Oriented in all Spheres: yes   Recent Memory: intact   Remote Memory: intact   Attention/Concentration/Focus: intact   Language: intact   Fund of Knowledge: intact  Safety   Harm to Self: yes,    endorses ideation:  yes  Harm to Others: no    Insight/ Judgement   Insight: fair   Judgment: fair    Assessment and Plan:    1.?? Bipolar disorder.  2.?? Anxiety disorder.  3.?? History of attention deficit hyperactivity disorder.  4.?? Rule out obsessive compulsive disorder.  5.?? Borderline personality disorder.    P:  1. Mood/ psychosis/ anxiety:  - started zoloft 25mg  daily 07/03/15 targeting anxiety/mood, titrate as tolerated  - started seroquel 50mg  q hs 07/03/15 for mood stabilization/insomnia/anxiety/psychotic sx, titrate as tolerated  - started clonazepam 0.5mg  bid 07/03/15 for anxiety.  Would continue as meds titrated d/t h/o med non-compliance d/t perceived se's and fears of catastrophic illness/death    2. Psychotherapy:  - I provided supportive, cognitive, and psychoeducational interventions during today's session.  - encouraged group/milieu therapy    3. Medical:  - nothing acute    4. Disp:   - TBD depending upon safety, med tolerability and symptom management    [place] Vanzanten D Melessia Kaus   07/03/2015

## 2015-07-04 MED ORDER — QUEtiapine (SEROQUEL) tablet 100 mg
100 | Freq: Every evening | ORAL | Status: AC
Start: 2015-07-04 — End: 2015-07-07
  Administered 2015-07-05 – 2015-07-07 (×3): 100 mg via ORAL

## 2015-07-04 MED ORDER — sertraline (ZOLOFT) tablet 50 mg
50 | Freq: Every day | ORAL | Status: AC
Start: 2015-07-04 — End: 2015-07-07
  Administered 2015-07-05 – 2015-07-07 (×3): 50 mg via ORAL

## 2015-07-04 MED FILL — QUETIAPINE 100 MG TABLET: 100 100 MG | ORAL | Qty: 1

## 2015-07-04 MED FILL — CLONAZEPAM 0.5 MG TABLET: 0.5 0.5 MG | ORAL | Qty: 1

## 2015-07-04 MED FILL — NICOTINE 21 MG/24 HR DAILY TRANSDERMAL PATCH: 21 21 mg/24 hr | TRANSDERMAL | Qty: 1

## 2015-07-04 MED FILL — SERTRALINE 25 MG TABLET: 25 25 MG | ORAL | Qty: 1

## 2015-07-04 NOTE — Unmapped (Signed)
[facility]   Inpatient Shift Assessment    Name: Jocelyn Schaefer  MRN: [id number]  Admission date: 07/01/2015    Client alert and oriented X4. Pleasant. Reports she has been very sleepy since start of Seraquel and Klonopin but that these meds have helped with her intrusive thoughts. Reviewed possible S/E's of medications. Denies SI/HI at present and reports she feels comfortable with staff and will let them know if she has any thoughts of hurting herself or others. SRA=18. Client denies pain. Ate 100% of her supper. Encouraged client to attend groups. Refused group. Isolated in room for most of this shift. No PRN medications were requested or given this shift.   Vitals:     Temp: 98.2 ??F (36.8 ??C)  Temp Source: Oral  Heart Rate: 109  Resp: 16  BP: 133/75 mmHg  BP Location: Left arm  BP Method: Automatic  Patient Position: Sitting  SpO2: 100 %  O2 Device: None (Room air)  Height: 5' 8 (172.7 cm)  Weight: (!) 233 lb (105.688 kg)  Weight Source: Stated Weight  BMI (Calculated): 35.5      Pain/ Pain Reassessment:      Denies pain this shift.          Intake:            Output:            POCT Glucose:            Suicide Risk Assessmnet:     Suicidal Thoughts: Infrequent or passive thoughts  Suicide Plan: None Present  Suicidal Intent: None Present  Ability to Engage for Safety Planning: Able and willing  History of Suicide Attempts: None Present  Lethality of Past-Self-Injurious Behavior (if more than one , score most sever): Superficial or non-suicidal self-injurious act without injury requiring treatment  Depresssion: Moderate, moody, sad  Anxiety: Low, denies episode of intense anxiety  Psychosis: Paranoid delusions or ideas of reference, with poor reality testing  Alcohol/Drug Use: Infrequent or past use only, no excessive use  Anger/Impulsivity: None  Hopelessness/ Overwhelmed Feelings: Moderate, frequent, but not excessive feelings  Medical Factors: Chronic and/or severly debilitation  Resources of  Support: Limited family/social resources  Situational Stressors: 1-2 stressors  Patient Score: 18  Observation Level: 15 minute checks  Assigned Risk: Moderate Risk        Mental Status Exam:     Apparent Age: Appears Actual Age  Hygiene/Grooming: Disheveled;Piercings  General Attitude: Cooperative  Motor Activity: Freedom of movement;Unremarkable  Eye Contact: Brief  Facial Expression: Anxious;Sad  Patient Behaviors: Cooperative;Withdrawn  Impulsivity: Normal  Speech Pattern: Within Defined Limits  Mood: Anxious;Depressed  Affect: Flat  Affect congruent with mood: Yes  Content: Within Defined Limits  Delusions: Within Defined Limits  Perception: Appropriate  Hallucination: None  Thought content appropriate to situation: Yes  Danger to Others (WDL): Within Defined Limits  Thought process: Circumstantial  Memory Impairment: None  Cognition: Ability to abstract;Appropriate safety awareness  Orientation Level: Oriented X4  Insight: Limited  Judgement: Poor  Appetite Change: Normal for patient  Do you have any sleep concerns?: Difficulty falling asleep/insomnia        Edmonson Fall Risk     Age: Less than 50  Mental Status: Fully Alert/ Oriented at all times  Medication: No medications  Psych Diagnosis: Bipolar/ schizoafective disorder  Ambulation/ Balance: Independent/ Steady gait/ Immobile  Nutrition: No apparent abnormalities with appetite  Sleep Disturbance: Report of sleep disturbance by patient, family or staff  History of Falls: No history of falls  Secondary Diagnosis: No medical problems  Edmonson Fall Risk Score: 6             Patient Checks:     Interventions: ID band on  Visual Checks: Standard Q15  Arm Bands On: ID  Patient Checked for Contraband: Belongings checked        Safety:     Self Injurious Thoughts: Denies  Self Injurious Behaviors: None observed  Family Suicide History: No  Thoughts of Harming Others: Denies  Do you have access to weapons in the home?: No  History of Aggression or Violence: No  history  Current Thoughts of Aggression: No  Does Patient's Family Have a History of Aggression or Violence?: No  Pt/Family Hx of Legal problems d/t aggression: No  Elopement risk?: No  Restraint Contraindications: None  Restrained/secluded in past year?: No  If restrained/secluded, notify someone?: Yes  Name of person to notify if restrained/secluded:  Darl Pikes Riccetti)  Methods to Calm Down: Quiet time in room;Change of environment;Reading        DASA     Irritablity: No  Verbal Threats: No  Impulsivity: No  Negative attitude: No  Unwillingness to follow direction: No  Sensitivity to perceived provocation: No  Easily angered when request denied: No  Total Score - DASA: 0      Withdrawl Symptoms:     Has Patient Abused Substances in the Past 7 Days?: Yes  Is Patient Showing Signs of Withdrawal?: No      Hygiene:     Bath/Shower: Independent  Level of Assistance: Independent      Nutrition Screen:     Feeding: Able to feed self  Diet Type: Regular  Appetite: Good        Truitt Merle, RN  07/04/2015  7:51 PM

## 2015-07-04 NOTE — Unmapped (Signed)
Patient had a flat affect this shift.  Her mood was anxious and depressed.  She is a low risk, as patient is able to contract for safety and is able to remain in her room the entire shift.  Patient refused all programming this shift, she rested throughout this entire day.  Patient came out of her room for meals only, where she ate 100% of her breakfast and lunch, (she is not social with staff and peers during this time).  Patient remains disheveled.

## 2015-07-04 NOTE — Unmapped (Signed)
Problem: Disposition  Intervention: Child psychotherapist will provide group therapy  that encourages increased coping skills, insight building and discharge preparedness.    Responsible staff: Vear Clock, LISW   Group Note    Patient Name: Jocelyn Schaefer    Date: 07/04/15      Time: 1 pm                Group Type: Social Work    Group Name: Stress Management    Group Objective: Learning about Stress Management     Attendance: Did not attend    Interactions: Did not interact    Mood/Affect: Unable to assess    Patient Participation: Pt did not attend group.     [name], MSW

## 2015-07-04 NOTE — Unmapped (Signed)
[facility]  Progress Note    Name: Jocelyn Schaefer  MRN: [id number]  Date: 07/04/2015  Time: 11:26 AM  Total Duration: 15 min  Psychotherapy Add-On Duration: n/a    Interval History:  Patient stable overnight.  Continues to express intrusive thoughts and paranoia.  Improved mood somewhat as thoughts are not making her as anxious and she is not feeling as helpless over her thoughts.  Slept okay.  Continues to feel tired during the day.  Appetite okay.  Continues to have suicidal ideation but able to CFS on the unit.  Encouraged patient to engage in groups as has been isolated throughout much of day and yesterday evening.  No current AVH.      Physical Review Of Systems:  Constitutional: negative  Eyes: negative  Ears, nose, mouth, throat, and face: negative  Respiratory: negative  Cardiovascular: negative  Gastrointestinal: negative  Genitourinary:negative  Integument/breast: negative  Hematologic/lymphatic: negative  Musculoskeletal:negative  Neurological: negative  Endocrine: negative  Allergic/Immunologic: negative    Psychiatric Review Of Systems:  Sleep: fair  Interest: diminished  Anhedonia: increased  Appetite Changes: varying  Weight Changes: No change  Energy: decreased  Libido: unaffected  Anxiety/Panic:  increased  Guilt: increased   Hopeless: increased but improved since admission  S.I.B.s/risky behavior:  increased but improved since admission    Objective:  Vital signs in last 24 hours:  Heart Rate:  [109] 109  Resp:  [16] 16  BP: (133)/(75) 133/75 mmHg    Labs:  Available labs reviewed  within normal limits    Scheduled Meds:  ??? clonazePAM  0.5 mg Oral BID   ??? nicotine  1 patch Transdermal Daily 0900   ??? quetiapine  100 mg Oral Nightly (2100)   ??? [START ON 07/05/2015] sertraline  50 mg Oral Daily 0900     PRN Meds:.acetaminophen, olanzapine zydis, ziprasidone    Mental Status Evaluation:  General     Development :normal    Body Habitus: overweight    Grooming/Hygiene : appropriately dressed      Demeanor: polite and cooperative    Eye Contact:  appropriate  Speech   Rate: Normal   Volume: Normal   Articulation:Normal   Quality: Normal     Motor   Strength/Tone: normal   Atrophy:none   Abnormal Movements: none   Station:normal     Gait: sitting on bed  Mood/ Affect   Mood:  depressed and anxious    Affect - Range: constricted      - Reactivity: blunted      - Appropriateness: appropriate to mood and/or situation  Thought    Content: obsessions, delusions and anxious ruminations   Process: normal   Associations: normal   Physical and Psychological Reality Testing : normal  Cognitive   Level of Alertness: sedated   Orientation to: time, place, person and situation   Alert and Oriented in all Spheres: yes   Recent Memory: intact   Remote Memory: intact   Attention/Concentration/Focus: intact   Language: normal   Fund of Knowledge: normal  Safety   Harm to Self: yes,    endorses ideation:  yes, endorses intent:  no, endorses plan:  no   Harm to Others: no    Insight/ Judgement   Insight: fair   Judgment: fair    Assessment and Plan:    Assessment: Jocelyn Schaefer is a 20 y.o. 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 The diagnoses are unchanged.     Plan:   Psychiatric    1. Treatment/Homework/Progress toward treatment goals: Dx and Tx were discussed with the patient.  Supportive psychotherapy and psychoeducation were provided.    2. Safety:  Unit status -- supervised.  Vital signs per unit protocol.    3. Psychopathology/Medication:   -- Encourage individual, group, and milieu therapy.?? ??  -- We will continue the current treatment plan and medication which are addressing the diagnoses except as stated: Increase Sertraline to 50mg  daily and increase Seroquel to 100mg  at bedtime. Continue PRN clonazepam BID.    -- Continue to monitor response to medication including monitoring mood, safety, and behaviors and make changes as  needed.  4. Labs:  Reviewed labs.   5. Status: voluntary    Medical  1. Consulted hospitalist for medical assessment and possible contributions of medical illness to presentation.  2. No acute concerns    Risks, benefits, side effects, and alternatives to the above plan were discussed with patient. Patient was educated on appropriate expectations as to the timing and efficacy of psychotropic medications (at least 4 weeks with antidepressants, etc.).  The pt voiced understanding and agreement with the treatment plan.    Dispo: Work on discharge planning for safe transition to less restrictive setting for management of psychiatric illness. Discharge is pending patient's safety, symptom improvement, and medication tolerability.     Decision making complexity: low to moderate. Components considered and discussed: patient safety, differential diagnosis, previous medication trials, risks of medication complications, risk of symptom relapse, and medication side effects.     Total unit and floor time 15 min of which more than 50% was spent face to face with patient in counseling/coordination of care discussing diagnosis, coping skills and treatment plan    Fanny Bien, MD, PhD    07/04/2015

## 2015-07-04 NOTE — Unmapped (Signed)
Pt asleep by 23:00 PM. Awake briefly @ 23:30 PM. Pt slept 6 3/4 hours this shift. Q 15 minute staggered checks maintained throughout shift per unit protocol.

## 2015-07-04 NOTE — Unmapped (Signed)
Problem: Coping/Self Expression Impairment  Goal: Increase coping skills  Pt stated, I want to figure out what???s wrong and find meds to manage my feelings. To get better. Pt will attended at least 1 Recreation Therapy group per day to gain healthy coping skills to help her get better.   Intervention: Provide leisure coping skills groups  Provide daily RT groups to encourage participation in appropriate leisure coping skills.    Responsible staff: Karen Chafe, CTRS  Name: Jocelyn Schaefer    Date: 07/04/2015    Time: 4:13 PM    Group Type: Recreation Therapy    Group Name: Gratitude Alpha Poems    Group Objective: Purpose of this group was for the participant to identify words of gratitude using each letter of the alphabet.  This exercise is to show the positive aspects in their life.     Attendance: Did not attend    Interaction: Did not interact    Mood/Affect: Unable to assess    Participation: Pt declined to participate in activity.       [name] Peculiar, CTRS

## 2015-07-05 MED FILL — MAPAP (ACETAMINOPHEN) 325 MG TABLET: 325 325 mg | ORAL | Qty: 2

## 2015-07-05 MED FILL — SERTRALINE 50 MG TABLET: 50 50 MG | ORAL | Qty: 1

## 2015-07-05 MED FILL — CLONAZEPAM 0.5 MG TABLET: 0.5 0.5 MG | ORAL | Qty: 1

## 2015-07-05 MED FILL — NICOTINE 21 MG/24 HR DAILY TRANSDERMAL PATCH: 21 21 mg/24 hr | TRANSDERMAL | Qty: 1

## 2015-07-05 MED FILL — QUETIAPINE 100 MG TABLET: 100 100 MG | ORAL | Qty: 1

## 2015-07-05 NOTE — Unmapped (Signed)
[facility]   Inpatient Shift Assessment    Name: Jocelyn Schaefer  MRN: [id number]  Admission date: 07/01/2015    Patient expressed she had a better day today.  When speaking to RN at beginning of shift she expressed she was having passive suicidal thoughts solely to escape the OCD intrusive thoughts.  She says these thoughts mostly revolve around the world ending and her loved ones dying.  RN encouraged her to verbalize these thoughts when they occur.  Patient expressed that talking about them helps.  Patient is anxious about her diagnosis and feels whatever is going on with her is not the typical anxiety she has experienced in the past.  Patient shared that she had an argument with her mother the night before, so she was feeling some anger and anxiety off and on throughout the day about that.  She rated her depression 4/10 and anxiety 7/10 but said they fluctuate all day.  Patient ate 100% of dinner and attended groups.  She said she enjoyed being more social tonight and using drawing as a coping skill.  Patient complained of 8/10 lower back pain and received Tylenol 650 mg at 1832 with effective results.  Patient was compliant with all other scheduled medications.   Jocelyn Schaefer is able to verbalize that she will remain safe at this time. Therefore, she may have her own clothes, standard linens and all silverware with meals. SRA = 19.    Vitals:     Temp: 97.5 ??F (36.4 ??C)  Temp Source: Oral  Heart Rate: 107  Resp: 16  BP: 127/75 mmHg  BP Location: Left arm  BP Method: Automatic  Patient Position: Sitting  SpO2: 100 %  O2 Device: None (Room air)  Height: 5' 8 (172.7 cm)  Weight: (!) 233 lb (105.688 kg)  Weight Source: Stated Weight  BMI (Calculated): 35.5      Pain/ Pain Reassessment:     Pain Score: 0-No pain  Pain Location: Back  Pain Orientation: Lower  Pain Descriptors: Throbbing  Pain Intervention(s): Medication (See eMAR)         Intake:            Output:            POCT Glucose:             Suicide Risk Assessmnet:     Suicidal Thoughts: Infrequent or passive thoughts  Suicide Plan: None Present  Suicidal Intent: None Present  Ability to Engage for Safety Planning: Able and willing  History of Suicide Attempts: None Present  Lethality of Past-Self-Injurious Behavior (if more than one , score most sever): Superficial or non-suicidal self-injurious act without injury requiring treatment  Depresssion: Moderate, moody, sad  Anxiety: Moderate, infrequent episodes of intense anxiety  Psychosis: Paranoid delusions or ideas of reference, with poor reality testing  Alcohol/Drug Use: Infrequent or past use only, no excessive use  Anger/Impulsivity: None  Hopelessness/ Overwhelmed Feelings: Moderate, frequent, but not excessive feelings  Medical Factors: Chronic or acute, with mild disruption of ADL  Resources of Support: Limited family/social resources  Situational Stressors: 1-2 stressors  Patient Score: 19  Observation Level: 15 minute checks  Assigned Risk: Moderate Risk        Mental Status Exam:     Hygiene/Grooming: Disheveled;Poor overall hygiene (Plan to shower)  General Attitude: Cooperative;Pleasant  Motor Activity: Freedom of movement  Eye Contact: Appropriate  Facial Expression: Anxious;Pensive  Patient Behaviors: Anxious;Cooperative  Impulsivity: Normal  Speech  Pattern: Within Defined Limits  Mood: Anxious  Affect: Responsive  Affect congruent with mood: Yes  Content: Within Defined Limits  Delusions: Paranoid  Perception: Appropriate  Hallucination: None  Thought content appropriate to situation: Yes  Danger to Others (WDL): Within Defined Limits  Thought process: Circumstantial  Insight: Impaired  Judgement: Impaired           Edmonson Fall Risk     Age: Less than 50  Mental Status: Fully Alert/ Oriented at all times  Medication: No medications  Psych Diagnosis: Bipolar/ schizoafective disorder  Ambulation/ Balance: Independent/ Steady gait/ Immobile  Nutrition: No apparent abnormalities  with appetite  Sleep Disturbance: Report of sleep disturbance by patient, family or staff  History of Falls: No history of falls  Secondary Diagnosis: No medical problems  Edmonson Fall Risk Score: 53             Patient Checks:     Interventions: ID band on  Visual Checks: Standard Q15  Arm Bands On: ID  Patient Checked for Contraband: Belongings checked        Safety:     Self Injurious Thoughts: Denies  Self Injurious Behaviors: None observed  Thoughts of Harming Others: Denies  Current Thoughts of Aggression: No  Elopement risk?: No  Methods to Calm Down: Quiet time in room;Reading        DASA     Irritablity: No  Verbal Threats: No  Impulsivity: No  Negative attitude: No  Unwillingness to follow direction: No  Sensitivity to perceived provocation: No  Easily angered when request denied: No  Total Score - DASA: 0      Withdrawl Symptoms:     Has Patient Abused Substances in the Past 7 Days?: Yes  Is Patient Showing Signs of Withdrawal?: No      Hygiene:     Bath/Shower: Shower  Level of Assistance: Independent      Nutrition Screen:              Jocelyn Schaefer  07/05/2015  10:24 PM

## 2015-07-05 NOTE — Unmapped (Signed)
Patient had a flat affect this shift.  Her mood was depressed.  She is a mild risk, as patient remains withdrawn/constricted.  She refused all programming this shift, coming out of her room for meals only.  Patient ate 100% of her breakfast and 75% of her lunch.  She is not social with staff and peers, patient slept for this entire shift.  She did not complete any ADL's, is disheveled.

## 2015-07-05 NOTE — Unmapped (Signed)
Pt asleep by 23:00 PM. Pt slept 7 hours this shift. Q 15 minute staggered checks maintained throughout shift per unit protocol.

## 2015-07-05 NOTE — Unmapped (Signed)
Problem: Coping/Self Expression Impairment  Goal: Increase coping skills  Pt stated, I want to figure out what???s wrong and find meds to manage my feelings. To get better. Pt will attended at least 1 Recreation Therapy group per day to gain healthy coping skills to help her get better.   Intervention: Provide leisure coping skills groups  Provide daily RT groups to encourage participation in appropriate leisure coping skills.    Responsible staff: Karen Chafe, CTRS  Name: Jocelyn Schaefer    Date: 07/05/2015    Time: 11:23 AM    Group Type: Recreation Therapy    Group Name -   Pass the Popcorn    Group Description -   Purpose of this group was to learn a new leisure pursuit while increasing socialization opportunities.  Participants learned to play a trivia game using a slightly different strategy.      Attendance: Did not attend    Interaction: Did not interact    Mood/Affect: Unable to assess    Participation: Pt declined to participate in activity.       Ravon Mortellaro E Ameir Faria, CTRS

## 2015-07-06 MED FILL — NICOTINE 21 MG/24 HR DAILY TRANSDERMAL PATCH: 21 21 mg/24 hr | TRANSDERMAL | Qty: 1

## 2015-07-06 MED FILL — CLONAZEPAM 0.5 MG TABLET: 0.5 0.5 MG | ORAL | Qty: 1

## 2015-07-06 MED FILL — MAPAP (ACETAMINOPHEN) 325 MG TABLET: 325 325 mg | ORAL | Qty: 2

## 2015-07-06 MED FILL — QUETIAPINE 100 MG TABLET: 100 100 MG | ORAL | Qty: 1

## 2015-07-06 MED FILL — SERTRALINE 50 MG TABLET: 50 50 MG | ORAL | Qty: 1

## 2015-07-06 NOTE — Unmapped (Signed)
Problem: Coping/Self Expression Impairment  Goal: Increase coping skills  Pt stated, I want to figure out what???s wrong and find meds to manage my feelings. To get better. Pt will attended at least 1 Recreation Therapy group per day to gain healthy coping skills to help her get better.   Intervention: Provide leisure coping skills groups  Provide daily RT groups to encourage participation in appropriate leisure coping skills.    Responsible staff: Karen Chafe, CTRS  Name: Jocelyn Schaefer    Date: 07/06/2015    Time: 11:36 AM    Group Type: Recreation Therapy    Group Name-  Zentangles    Group Description-   The purpose of this group was to provide an opportunity for creative expression.  Zentangles involve a form of drawing that is relaxing and promotes mindfulness and focus.  Pts were instructed to draw a box and then fill it with lines/patterns and to focus on relaxing.  This activity is also an opportunity for pts to learn a new leisure activity that can easily be used at home as a Associate Professor.      Attendance: Did not attend    Interaction: Did not interact    Mood/Affect: Unable to assess    Participation: Pt declined to participate in activity.       Khadir Roam E Juanelle Trueheart, CTRS

## 2015-07-06 NOTE — Unmapped (Signed)
Pt lying down @ 23:00 PM. Asleep by 23:44 PM. Pt slept 6 1/4 hours this shift. Q 15 minute staggered checks maintained throughout shift per unit protocol.

## 2015-07-06 NOTE — Unmapped (Signed)
[facility]   Inpatient Shift Assessment    Name: Jocelyn Schaefer  MRN: [id number]  Admission date: 07/01/2015    Jocelyn Schaefer was much more active in the milieu this shift.  She spent time socializing with peers and attending groups.  She states that socializing and drawing are helpful coping skills for her.  Patient is pleasant and very responsive in conversation.  She especially brightens when talking about her interests, such as music and television shows.  Patient rates her anxiety 6/10 and depression 2/10.  Patient shared with RN that she considers herself to have a non-binary gender.  She explained that she does not fully identify as female or female and has considered transitioning in the future to have more masculine physical traits.  Patient is looking forward to discharge.  Patient received Tylenol 650 mg at 1932 for 8/10 lower back pain with effective results.  Patient is compliant with scheduled medications.  Jocelyn Schaefer is able to verbalize that she will remain safe at this time. Therefore, she may have her own clothes, standard linens and all silverware with meals. SRA = 16.    Vitals:     Temp: 97.3 ??F (36.3 ??C)  Temp Source: Oral  Heart Rate: 89  Resp: 17  BP: 129/69 mmHg  BP Location: Left arm  BP Method: Automatic  Patient Position: Sitting  SpO2: 100 %  O2 Device: None (Room air)  Height: 5' 8 (172.7 cm)  Weight: (!) 233 lb (105.688 kg)  Weight Source: Stated Weight  BMI (Calculated): 35.5      Pain/ Pain Reassessment:     Pain Score:   8  Re-Assessment Pain Score:   1  Pain Medication Side Effects Noted: None      Intake:            Output:            POCT Glucose:            Suicide Risk Assessmnet:     Suicidal Thoughts: Infrequent or passive thoughts  Suicide Plan: None Present  Suicidal Intent: None Present  Ability to Engage for Safety Planning: Able and willing  History of Suicide Attempts: None Present  Lethality of Past-Self-Injurious Behavior (if more than one , score most  sever): Superficial or non-suicidal self-injurious act without injury requiring treatment  Depresssion: Mild, feels slightly down  Anxiety: Moderate, infrequent episodes of intense anxiety  Psychosis: Paranoid delusions or ideas of reference, with poor reality testing  Alcohol/Drug Use: Infrequent or past use only, no excessive use  Anger/Impulsivity: None  Hopelessness/ Overwhelmed Feelings: Low, infrequent feelings  Medical Factors: Chronic or acute, with mild disruption of ADL  Resources of Support: Limited family/social resources  Situational Stressors: 1-2 stressors  Patient Score: 16        Mental Status Exam:     Apparent Age: Appears Actual Age  Hygiene/Grooming: Disheveled  General Attitude: Cooperative;Pleasant  Motor Activity: Freedom of movement  Eye Contact: Appropriate  Facial Expression:  (Appropriate)  Patient Behaviors: Calm;Cooperative  Impulsivity: Normal  Speech Pattern: Within Defined Limits  Mood: Within Defined Limits  Affect: Responsive  Affect congruent with mood: Yes  Content: Within Defined Limits  Delusions: Paranoid  Perception: Appropriate  Hallucination: None  Thought content appropriate to situation: Yes  Danger to Others (WDL): Within Defined Limits  Thought process: Circumstantial  Insight: Impaired  Judgement: Impaired           Edmonson Fall Risk  Age: Less than 50  Mental Status: Fully Alert/ Oriented at all times  Medication: No medications  Psych Diagnosis: Bipolar/ schizoafective disorder  Ambulation/ Balance: Independent/ Steady gait/ Immobile  Nutrition: No apparent abnormalities with appetite  Sleep Disturbance: Report of sleep disturbance by patient, family or staff  History of Falls: No history of falls  Secondary Diagnosis: No medical problems  Edmonson Fall Risk Score: 101             Patient Checks:     Interventions: ID band on  Visual Checks: Standard Q15  Arm Bands On: ID  Patient Checked for Contraband: Belongings checked        Safety:     Self Injurious  Thoughts: Denies  Self Injurious Behaviors: None observed  Thoughts of Harming Others: Denies  Current Thoughts of Aggression: No  Elopement risk?: No  Methods to Calm Down: Quiet time in room;Reading (Drawing)        DASA     Irritablity: No  Verbal Threats: No  Impulsivity: No  Negative attitude: No  Unwillingness to follow direction: No  Sensitivity to perceived provocation: No  Easily angered when request denied: No  Total Score - DASA: 0      Withdrawl Symptoms:     Has Patient Abused Substances in the Past 7 Days?: Yes  Is Patient Showing Signs of Withdrawal?: No      Hygiene:     Level of Assistance: Independent      Nutrition Screen:     Feeding: Able to feed self  Diet Type: Regular  Appetite: Good        Jocelyn Schaefer  07/06/2015  10:15 PM

## 2015-07-06 NOTE — Unmapped (Signed)
Patient had a flat affect this shift.  Her mood was anxious and sad.  She is a low risk, as patient is on track for discharge tomorrow.  She read this shift to help her cope.  Patient did not attend any programming and was not social with staff or peers.  She did come out of her room, where she ate 100% of her breakfast and lunch.  This afternoon, patient had a visit from her mother that appeared to go well.

## 2015-07-06 NOTE — Unmapped (Signed)
[facility]  Progress Note    Name: Jocelyn Schaefer  MRN: [id number]  Date: 07/06/2015   Time: 10:41 AM  Total Duration:  20 mins  Psychotherapy duration: 16 mins    S:  Ok.  Ongoing anxious ruminations, cause and effect scenarios, dwells on anxious thoughts.  Anxiety about inability to check weather app on phone.  Discussed likely ocd dx. Felt reassured to have information.  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  Only makes phone calls on quarter or half hours.  Passive SI last night without plan.  + sh urges but didn't engage as only burns self, no access here.  Difficult visit with M.  Ongoing delusions, believes people talking about her, going through computer, struggles to be seen in groups, believes phones tapped.  Denies ah.  Mood lability.  Hard to get OOB and wants to isolate but trying to push self.  Sleep improved with seroquel.  Anxiety is improved since meds started.  Rates depression 3-4/10 (10 high) and anxiety 5  NR:  Most of weekend in bed; no shower until last night despite encouragement; no groups, depressed, passive SI, eating 100%, back pain, + anxiety, c/o OCD intrusive thoughts about world ending; more social last night, argued with M during visit, ? why    Objective:      Vital signs in last 24 hours:    Filed Vitals:    07/06/15 0818   BP: 129/69   Pulse: 89   Temp: 97.3 ??F (36.3 ??C)   Resp: 17   SpO2: 100%         Labs:    Available labs reviewed     Scheduled Meds:    Current Facility-Administered Medications   Medication Dose Route Frequency Provider Last Rate Last Dose   ??? acetaminophen (TYLENOL) tablet 650 mg  650 mg Oral Q4H PRN Jolomi Ikomi   650 mg at 07/06/15 0800   ??? clonazePAM (klonoPIN) tablet 0.5 mg  0.5 mg Oral BID Marguerita Beards, CNP   0.5 mg at 07/06/15 0800   ??? nicotine (NICODERM CQ) 21 mg/24 hr 1 patch  1 patch Transdermal Daily 0900 Jolomi Ikomi   1 patch at 07/04/15 0832   ??? olanzapine zydis (ZYPREXA) disintegrating tablet 5 mg  5 mg Oral Q4H PRN  Jolomi Ikomi   5 mg at 07/03/15 2207   ??? QUEtiapine (SEROQUEL) tablet 100 mg  100 mg Oral Nightly (2100) Fanny Bien, MD   100 mg at 07/05/15 2113   ??? sertraline (ZOLOFT) tablet 50 mg  50 mg Oral Daily 0900 Fanny Bien, MD   50 mg at 07/06/15 0800   ??? ziprasidone (GEODON) injection 20 mg  20 mg Intramuscular UD PRN Jolomi Ikomi         Mental Status Evaluation:  General ??  ???????????????????????? Development :normal  ???????????????????????? Body Habitus: overweight  ???????????????????????? Grooming/Hygiene : appropriately dressed, multiple facial piercings  ???????????????????????? Demeanor: polite and cooperative  ???????????????????????? Eye Contact:?? appropriate  Speech  ????????????????????????Rate: Normal  ????????????????????????Volume: Normal  ????????????????????????Articulation:Normal  ????????????????????????Quality: Normal  ????????????????????????  Motor  ????????????????????????Strength/Tone: normal  ????????????????????????Atrophy:none  ????????????????????????Abnormal Movements: none  ????????????????????????Station:normal??????????????????????????  ????????????????????????Gait: normal  Mood/ Affect  ????????????????????????Mood:?? depressed and anxious   ????????????????????????Affect??????- Range: normal  ?????????????????????????? ????????????????????- Reactivity: normal??????????  ???????????????????????? ??????????????????????- Appropriateness: appropriate to mood and/or situation  Thought   ????????????????????????Content: obsessions, delusions and anxious ruminations  ????????????????????????Process: normal  ????????????????????????Associations: normal  ????????????????????????Physical and Psychological Reality Testing :  normal  Cognitive  ????????????????????????Level of Alertness: sedated  ????????????????????????Orientation to: time, place, person and situation  ????????????????????????Alert and Oriented in all Spheres: yes  ????????????????????????Recent Memory: intact  ????????????????????????Remote Memory: intact  ????????????????????????Attention/Concentration/Focus: intact  ????????????????????????Language: intact  ????????????????????????Fund of Knowledge: intact  Safety  ????????????????????????Harm to Self: yes,?????? endorses ideation:?? yes  ????????????????????????Harm to Others: no    Insight/ Judgement  ????????????????????????Insight: fair  ????????????????????????Judgment: fair    Assessment and Plan:    1.??  Bipolar disorder, currently depressed  2.?? Anxiety disorder, nos.  3.?? History of attention deficit hyperactivity disorder.  4.?? Rule out obsessive compulsive disorder.  5.?? Borderline personality disorder.    P:  1. Mood/ psychosis/ anxiety:  - started zoloft 25mg  daily 07/03/15 targeting anxiety/mood.  Increased to 50mg /day on 4/1  - started seroquel 50mg  q hs 07/03/15 for mood stabilization/insomnia/anxiety/psychotic sx, titrate as tolerated.  Increased to 100mg /day on 07/04/15  - started clonazepam 0.5mg  bid 07/03/15 for anxiety.?? Would continue as meds titrated d/t h/o med non-compliance d/t perceived se's and fears of catastrophic illness/death    2. Psychotherapy:  - I provided supportive, cognitive, and psychoeducational interventions during today's session.  - encouraged group/milieu therapy    3. Medical:  - nothing acute    4. Disp:   - TBD depending upon safety, med tolerability and symptom management  - declines PHP, doesn't think will be helpful.  Agrees to outpt f/u. Plan for dc tomorrow      Keishaun Hazel D Tekoa Amon   07/06/2015

## 2015-07-06 NOTE — Unmapped (Signed)
Problem: Disposition  Intervention: Disposition Intervention  Social worker will assist the patient in planning their discharge as evidenced by discussing relapse prevention including treatment recommendations, providing referrals and assisting in securing follow up appointments.    Responsible staff: Vear Clock, LISW   SW met with pt to review OP providers that pt would be open to getting aftercare follow up appointments with.  SW obtained ROIs and will contact providers to get aftercare appointments scheduled.  Pt reports feeling chill.  Pt states that her medications are helping her her feel like she can manage her thoughts more.  Denies SI, states that she has some delusional thinking upon waking stating that she saw a vision/omen that symbolized doom.  She feels she is able to manage these thoughts and stay grounded in reality.

## 2015-07-06 NOTE — Unmapped (Addendum)
Problem: Disposition  Intervention: Child psychotherapist will provide group therapy  that encourages increased coping skills, insight building and discharge preparedness.    Responsible staff: Vear Clock, LISW   Group Note    Patient Name: Jocelyn Schaefer    Date: 07/06/2015      Time: 1600                 Group Type: Enrichment    Group Name: Healthy Ways to Process Rejection        Group Objective: to learn the definition of rejection; to articulate feelings when experiencing rejection; to describe their responses to rejection' to discuss strategies for procession experience and feelings of rejection and which of these strategies they will use.        Attendance: Attended    Interactions: Interacted appropriately    Mood/Affect: Appropriate    Patient Participation: Patient listened and participated in discussion. Patient shared feelings and experiences associated with rejection and gave feedback to peers.     Thanh Mottern, DMin

## 2015-07-06 NOTE — Unmapped (Signed)
Problem: Coping/Self Expression Impairment  Goal: Increase coping skills  Pt stated, I want to figure out what???s wrong and find meds to manage my feelings. To get better. Pt will attended at least 1 Recreation Therapy group per day to gain healthy coping skills to help her get better.   Intervention: Provide leisure coping skills groups  Provide daily RT groups to encourage participation in appropriate leisure coping skills.    Responsible staff: Karen Chafe, CTRS  Name: Jocelyn Schaefer    Date: 07/06/2015    Time: 4:23 PM    Group Type: Recreation Therapy    Group Name -   Pet Therapy  Group Description -   Purpose of this group is to increase pt socialization, relieve stress of hospitalization, and elevate patient???s mood.  Patients pet, feed, play with a certified therapy dog.  Pts talk about their own pets and past experiences they have had with pets.     Attendance: Attended    Interaction: Interacted appropriately     Mood/Affect: Appropriate    Participation: Pt engaged in group activity.  Pt was able to interact appropriately with dog.  Pt discussed personal pets with group.       Levester Waldridge E Vihaan Gloss, CTRS

## 2015-07-06 NOTE — Unmapped (Signed)
Problem: Disposition  Intervention: Child psychotherapist will provide group therapy  that encourages increased coping skills, insight building and discharge preparedness.    Responsible staff: Vear Clock, LISW   Group Note    Patient Name: Jocelyn Schaefer    Date: 07/06/15      Time: 1:00pm                Group Type: Social Work    Group Name: Positive Psychology    Group Objective: Learn about Positive Psychology techniques and identify personal strengths and how they can be used to improve personal well-being.     Attendance: Did not attend    Interactions: Did not interact    Mood/Affect: Unable to assess    Patient Participation: Patient did not attend group, even with prompting from this SW. She spent the time alone in her room.     Randye Lobo, MSW, LSW

## 2015-07-07 MED ORDER — clonazePAM (KLONOPIN) 0.5 MG tablet
0.5 | ORAL_TABLET | Freq: Two times a day (BID) | ORAL | Status: AC
Start: 2015-07-07 — End: ?

## 2015-07-07 MED ORDER — QUEtiapine (SEROQUEL) 100 MG tablet
100 | ORAL_TABLET | Freq: Every evening | ORAL | Status: AC
Start: 2015-07-07 — End: ?

## 2015-07-07 MED ORDER — sertraline (ZOLOFT) 50 MG tablet
50 | ORAL_TABLET | Freq: Every day | ORAL | Status: AC
Start: 2015-07-07 — End: ?

## 2015-07-07 MED FILL — CLONAZEPAM 0.5 MG TABLET: 0.5 0.5 MG | ORAL | Qty: 1

## 2015-07-07 MED FILL — SERTRALINE 50 MG TABLET: 50 50 MG | ORAL | Qty: 1

## 2015-07-07 NOTE — Unmapped (Signed)
[facility]    Department of Psychiatry    Discharge Summary      Patient Name: Jocelyn Schaefer  MRN: [id number]  Duration: 35 mins    Admission date:  07/01/2015    Discharge:   Date: 07/07/2015  Location: Home    Diagnosis on Admission:  1. Bipolar disorder.   2. Anxiety disorder.   3. History of attention deficit hyperactivity disorder.   4. Rule out obsessive compulsive disorder.   5. Borderline personality disorder.     Reason for Admission:       CHIEF COMPLAINT: Suicidal meltdown.     HISTORY OF PRESENT ILLNESS: The patient is a 20 year old female who presents with increased depression, anxiety and suicidal ideation. She reports she has struggled with anxiety since age 54 or 34, though has noted a significant increase in symptoms over the past year without identifiable trigger. She fears the world ending, states that this fear is paralyzing and thought that she should end her own life so that she can be in control of her life and death. She reports she has had longstanding fear of illness since age 55 or 80, but subsequently increased significantly after her grandmother, that she was very close to, died after she was in high school. Is constantly in fear that she will make a bad impression or someone will kill her. Fears that people are following her. Believes that other people can hear her thoughts and notes this began last year and has progressively increased over time. Historically has been tried on different medications, but has poor followup and additionally experiences se's, which then reinforces fears that she is going to get catastrophic illness and die so she stops the medications. She reports ongoing passive suicidal ideation without current plan or intent. She struggled with   suicidal ideation since age 49. She has had no suicide attempts. She does   have a history of self-harm behaviors, originally would cut self when she was younger, but has not cut within the past year.  However, she has been burning herself with cigarettes over the past year, most recent was 3 weeks ago.   Patient reports that she was originally diagnosed with attention deficit   hyperactivity disorder, but the diagnosis was changed to bipolar disorder   while she was in high school. She notes that typically she struggles with   depressed mood more so than manic symptoms and manic symptoms usually last a week to two weeks and typically occur once per month. Has ongoing struggles with interpersonal relationships. Often becomes jealous of friends and fears abandonment.     Depressive symptoms: Depressed mood. Poor sleep. Historically always likes to be awake at night. 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   Feels unable to sleep at night. Usually does not go to bed until around 3 to 4 a.m., usually awake by 6 to 8 a.m., states that she averages 3-5 hours of sleep per night. Appetite varies. Endorses poor concentration, guilt, hopelessness, poor self-esteem, difficulty with ADL completion (showers once a week). Intermittent passive SI, anhedonia (no longer interested in hobbies that she once found enjoyable). Poor energy, but notes that this can fluctuate a lot, can have periods where she is running around the house in an effort to burn excessive energy, but most recently has had consistently low energy. Endorses daily tearfulness.     Manic symptoms: Describes insomnia with energy, rapid speech,  distractibility, racing thoughts, particularly at night. Some grandiosity   where she describes herself as psychic, but she believes this to be true.   Endorses impulsive reckless behaviors. Engages in online shopping where she   will spend her entire paycheck. Also, reckless driving where she will race   people so that they will pass her on the road. Notes that she shops if she   is angry, irritated or stressed. She does endorse irritability, mood swings   and intermittent goal-directed activities,  typically related to drawing.     Anxiety symptoms: She was diagnosed with anxiety at age 50 or 57. She endorses constant anxiety about health, states that she is paralyzed with fear of cancer or dying by fire, seems to be obsessive thoughts. She fears that thinking something will make it happen. She fears being perceived as an abusive person. She has anxiety with strangers, fears that she will make a bad first impression or someone will try to kill her. Often fears that people are following her to try to kill her. She endorses intrusive thoughts of guilt related to religion and fears that she is believing the wrong thing because she does not believe in God, but fears God. Fears that she is being disregarded by others and is always a bad guy. She reports longstanding fear of illness since age 78 or 54 and this seemed to improve while she was in high school, but subsequently increased after her grandmother, whom she was very close to, passed away after she graduated high school. She endorses irritability, feeling on edge, sleep disruption related to racing thoughts, somatic complaints including upset stomach, chest pain and tightness, a lump in throat. Also describes restlessness and fatigue. She does report prior panic attacks, says it feels like she is having a heart attack or a stroke, can last 15 minutes to 2 days. At times can be so impairing that she feels that she cannot even get out of bed. States that this typically happens once to twice per week.     Psychotic symptoms: She endorses longstanding intermittent history of   auditory hallucinations, but she describes this as being infrequent. States   that she can hear A [employer] voice say nothing though is only triggered if she hears something that is being played very fast (ie, song being sped up faster than normal). She denies any history of command hallucinations. She denies visual hallucinations. She does endorse paranoia in that she does not trust the  police or her parents. She does describe delusions where she believes that she is being watched through text or internet. She believes that she is being punished for her actions. She thinks that other people can hear her thoughts; notes these symptoms started over the last year and have gotten progressively worse since that time. She is fearful of losing what she calls this 6th sense. Reports that around age 68 or 69, believed her entire family were aliens and refused to talk to them or eat any food they prepared for 2 months.     PAST PSYCHIATRIC HISTORY:   Previous inpatient admission: She reports she has been admitted 3 or 4 times for inpatient treatment, all at [facility], most recently was in 2015.   Previous history of violence to self or others: Yes. Prior history of   cutting behaviors when she was younger, though none in the past year. Has   now begun burning herself with cigarettes over the past year, most recent  episode was 3 weeks ago. Denies aggression towards others or animals.   Past/Current Outpatient Treatment: Admits that historically she does not   follow up with outpatient care recommendations because she is bad at keeping appointments often because her fears make it difficult for her to get to the appointments. She is scheduled for a therapy session at [facility] on April 3rd.     PAST PSYCHIATRIC MEDICATION TRIALS:   1. Lexapro, does not recall this.   2. Lamictal, no effect.   3. Wellbutrin, does not recall this.   4. Lithium, had negative side effects that she is not sure if was related to Risperdal or lithium, because she was prescribed these at the same time, but prefers to avoid lithium.   5. Risperdal, nightmares, tongue swelling.   6. Prozac, thinks it may have been somewhat effective. Has been on this at different times in her life; though tends to experience GI side effects and then becomes fearful that she has a life threatening illness, so she stops the medications.      SUBSTANCE ABUSE HISTORY: Also see social work history.     Alcohol: Began drinking at age 66. Notes variable use, but describes   infrequent use. Will have a couple of shots though occasionally will binge   drink and drink a full bottle of wine or vodka. Notes that her last drink was one week ago.     Tobacco use: Smokes 1 pack of cigarettes every 4 days for the past 3-4   Years.     Recreational drugs: Marijuana, began using at age 37. Describes occasional   use once a week. Initially began using for anxiety, but most recently notes   that it has been making anxiety worse. Vicodin, reports she used this   previously for 1-2 months a couple of years ago. None recent. She denies   history of cocaine, benzodiazepine or stimulant abuse.     PAST MEDICAL HISTORY: Depression, anxiety, bipolar disorder, asthma,   borderline personality disorder.    Hospital Course Including Rationale for Medication Changes Made and Medically Focused Treatment Recommendations:    Patient was admitted on a voluntary basis. Patient was seen and evaluated by a multidisciplinary treatment team, in this evaluation patient was able to contribute freely. Patient was able to provide informed consent and outline the risks and benefits of medications.  Pt participated in unit programming and, overall, made fair use of the available therapeutic opportunities.     1. Mood/ psychosis/ anxiety:  - started zoloft 25mg  daily 07/03/15 targeting anxiety/mood.?? Increased to 50mg /day on 4/1.  Discussed if true OCD, may require higher dosing  - started seroquel 50mg  q hs 07/03/15 for mood stabilization/insomnia/anxiety/psychotic sx, titrate as tolerated.?? Increased to 100mg /day on 07/04/15  - started clonazepam 0.5mg  bid 07/03/15 for anxiety.?? Would continue as meds titrated d/t h/o med non-compliance d/t perceived se's and fears of catastrophic illness/death      At time of dc, pt reports improvement in mood.  Anxiety and intrusive thoughts decreased but  remain present, ongoing fear of rapture. Notes significant reduction in amount of time before able to move past anxious/intrusive thoughts.  Sleep was improved, keeping more consistent schedule.  Encouraged good sleep hygiene.  Was initially isolative but gradually increased social interaction and felt as if she were flourishing in ability to engage with peers.  Denies sh/si thoughts > 24 hours prior to dc; denies avh.        - declines PHP, doesn't think  will be helpful.?? Agrees to outpt f/u. Pt will follow up with Dr. Harlon Flor on 07/29/15 for medication management.?? Dr. Harlon Flor will assign a therapist at the time of this appointment      Discharge Diagnosis:  1.?? Bipolar disorder, currently depressed  2.?? Anxiety disorder, nos.  3.?? History of attention deficit hyperactivity disorder.  4.?? Rule out obsessive compulsive disorder.  5.?? Borderline personality disorder.        Complications: none    Consults: Internal Medicine.    Prescriptions prior to admission   Medication Sig Dispense Refill Last Dose   ??? lactobacillus rhamnosus, GG, (CULTURELLE) 10 billion cell capsule Take 1 capsule by mouth daily. 14 capsule 0 Taking   ??? sulfamethoxazole-trimethoprim (BACTRIM DS) 800-160 mg per tablet Take 1 tablet by mouth daily. After intercourse 20 tablet 1 Not Taking       Current Discharge Medication List      START taking these medications    Details   clonazePAM (KLONOPIN) 0.5 MG tablet Take 1 tablet (0.5 mg total) by mouth 2 times a day.  Qty: 60 tablet, Refills: 0      QUEtiapine (SEROQUEL) 100 MG tablet Take 1 tablet (100 mg total) by mouth at bedtime.  Qty: 30 tablet, Refills: 0      sertraline (ZOLOFT) 50 MG tablet Take 1 tablet (50 mg total) by mouth daily.  Qty: 30 tablet, Refills: 0         CONTINUE these medications which have NOT CHANGED    Details   lactobacillus rhamnosus, GG, (CULTURELLE) 10 billion cell capsule Take 1 capsule by mouth daily.  Qty: 14 capsule, Refills: 0      sulfamethoxazole-trimethoprim  (BACTRIM DS) 800-160 mg per tablet Take 1 tablet by mouth daily. After intercourse  Qty: 20 tablet, Refills: 1             Is the patient prescribed multiple antipsychotics? No    Was an FDA-approved tobacco cessation medication prescribed at discharge? No  A prescription for tobacco cessation medication was offered at discharge and the patient refused.    Allergies   Allergen Reactions   ??? Prozac [Fluoxetine] Other (See Comments)     GI upset and anxiety was worse   ??? Spironolactone      Indigestion, muscle aches , dizziness ,         Pertinent Physical Findings: multiple piercings  Pertinent Lab/Test Findings: N/A    I met with pt to review sx and progress as well as discharge planning.  Prepared d/c paperwork, including rx.       Mental Status Evaluation:  Appearance:  casually dressed   Behavior:  normal   Speech:  normal pitch and normal volume   Mood:  anxious and euthymic   Affect:  mood-congruent   Thought Process:  normal   Thought Content:  obsessions   Sensorium:  person, place, time/date and situation   Memory: intact   Cognition:  grossly intact   Insight:  fair   Judgment:  fair       Condition on Discharge: Stable, Not imminently dangerous self/others and Adequately in touch with reality    Averyanna Sax Venida Jarvis, CNP

## 2015-07-07 NOTE — Unmapped (Signed)
LGBT resources:  [facility]  [address] [place], [place]   [phone] phone  [email or web address].org    DBT therapy resources:  [facility]  [phone] phone    Crisis Management Plan  Remove all firearms, weapons (of any kind) or any unneeded medicines that could be used.  Identify a support person/advocate and attend follow-up mental health appointments with this person.  Be direct and talk openly about suicidal thoughts.  Allow expression of feelings.  Block all inappropriate internet websites and social media.  Get help from agencies that specialize in crisis intervention.  Create a personalized safety plan.   The following list are suicide prevention resources available 24 hours a day.    [facility]:   [facility] Consultation and Crisis [phone]    National:    National Hotline:    1-800-273-TALK(8255)     www.suicidepreventionlifeline.org

## 2015-07-07 NOTE — Unmapped (Signed)
Problem: Disposition  Intervention: Disposition Intervention  Social worker will assist the patient in planning their discharge as evidenced by discussing relapse prevention including treatment recommendations, providing referrals and assisting in securing follow up appointments.    Responsible staff: Jocelyn Clock, LISW   Discharge Note:  Pt leaving today per Jocelyn Sauger, NP with mother to transport home. Pt reviewed and signed the discharge treatment plan and completed the satisfaction survey using [employer]. Pt signed ROI for outpatient provider. Pt has 30 days scripts for medications. Pt and SW reviewed discharge safety plan. Pt will follow up with Dr. Harlon Schaefer on 07/29/15 for medication management.  Dr. Harlon Schaefer will assign a therapist at the time of this appointment.  Pt's mood was stable and affect was congruent, denied SI/HI, denied A/V hallucinations, appeared alert and oriented x4, appeared to have organized thoughts.  SW provided letter for work.

## 2015-07-07 NOTE — Unmapped (Signed)
[facility]   Inpatient Shift Assessment    Name: Jocelyn Schaefer  MRN: [id number]  Admission date: 07/01/2015    Pt observed to be Happy, pleasant and cooperative this shift.  Denies thoughts of self harm.  Suicide Risk Assessment=  12   , low.  Pt attended and engaged in all unit programming this shift.  Ate 100  % of Dinner.  Pt was medication compliant this shift.  No medical concerns observed or reported to this Clinical research associate.            Reviewed discharge instructions, follow-up appointments, treatment plans and medications with patient--Pt shows understanding and readiness to leave.  Pt denies suicidal and homicidal ideation.  Pt is observed to be euthymic and calm.  Pt is able to verbalize medication information (dose, route, side effects) and states no further questions regarding medications.  Pt did not have any medical concerns at time of discharge.  Pt given prescriptions, home medications and gathered belongings.  Pt and her mother  was accompanied by Dianne Dun RN at discharge.  Discharged @ 17:47 and escorted off the unit by [facility].        Vitals:     Temp: 98.2 ??F (36.8 ??C)  Temp Source: Oral  Heart Rate: 116  Resp: 16  BP: 132/58 mmHg  BP Location: Right arm  BP Method: Automatic  Patient Position: Sitting  SpO2: 99 %  O2 Device: None (Room air)  Height: 5' 8 (172.7 cm)  Weight: (!) 233 lb (105.688 kg)  Weight Source: Stated Weight  BMI (Calculated): 35.5      Pain/ Pain Reassessment:     Pain Score: 0-No pain         Intake:            Output:            POCT Glucose:            Suicide Risk Assessmnet:     Suicidal Thoughts: None Present  Suicide Plan: None Present  Suicidal Intent: None Present  Ability to Engage for Safety Planning: Able and willing  History of Suicide Attempts: None Present  Lethality of Past-Self-Injurious Behavior (if more than one , score most sever): Superficial or non-suicidal self-injurious act without injury requiring treatment  Depresssion: Mild, feels slightly  down  Anxiety: Moderate, infrequent episodes of intense anxiety  Psychosis: Some mild delusion but reality testing intact  Alcohol/Drug Use: Infrequent or past use only, no excessive use  Anger/Impulsivity: None  Hopelessness/ Overwhelmed Feelings: Low, infrequent feelings  Medical Factors: Acute , but short term, no disruption of ADL  Resources of Support: Limited family/social resources  Situational Stressors: 1-2 stressors  Patient Score: 12  Observation Level: 15 minute checks  Assigned Risk: Low Risk        Mental Status Exam:     Apparent Age: Appears Actual Age  Hygiene/Grooming: Disheveled  General Attitude: Cooperative  Motor Activity: Unremarkable  Eye Contact: Appropriate  Facial Expression: Animated  Patient Behaviors: Cooperative  Impulsivity: Normal  Speech Pattern: Within Defined Limits  Mood: Within Defined Limits  Affect: Responsive  Affect congruent with mood: Yes  Content: Within Defined Limits  Delusions: Within Defined Limits  Perception: Appropriate  Hallucination: None  Thought content appropriate to situation: Yes  Danger to Others (WDL): Within Defined Limits  Thought process: Goals directed  Memory Impairment: None  Cognition: Ability to abstract  Orientation Level: Oriented X4  Attention Span: Poor concentration  Insight:  Average  Judgement: Average  Appetite Change: Normal for patient  Do you have any sleep concerns?: Excessive sleeping  Libido: Normal        Edmonson Fall Risk     Age: Less than 50  Mental Status: Fully Alert/ Oriented at all times  Medication: No medications  Psych Diagnosis: Bipolar/ schizoafective disorder  Ambulation/ Balance: Independent/ Steady gait/ Immobile  Nutrition: No apparent abnormalities with appetite  Sleep Disturbance: Report of sleep disturbance by patient, family or staff  History of Falls: No history of falls  Secondary Diagnosis: No medical problems  Edmonson Fall Risk Score: 32             Patient Checks:     Interventions: Call bell within reach,  ID band on  Visual Checks: Standard Q15  Arm Bands On: ID  Patient Checked for Contraband: Belongings checked        Safety:     Self Injurious Thoughts: Denies  Self Injurious Behaviors: None observed  Thoughts of Harming Others: Denies  Current Thoughts of Aggression: No  Elopement risk?: No  Methods to Calm Down: Reading;1:1 Time        DASA     Irritablity: No  Verbal Threats: No  Impulsivity: No  Negative attitude: No  Unwillingness to follow direction: No  Sensitivity to perceived provocation: No  Easily angered when request denied: No  Total Score - DASA: 0      Withdrawl Symptoms:     Has Patient Abused Substances in the Past 7 Days?: Yes  Is Patient Showing Signs of Withdrawal?: No      Hygiene:     Bath/Shower: Independent  Oral Care: Independent  Level of Assistance: Independent      Nutrition Screen:     Feeding: Able to feed self  Diet Type: Regular  Appetite: Good        Deeann Dowse, RN  07/07/2015  5:54 PM

## 2015-07-07 NOTE — Nursing Note (Signed)
 Jocelyn Schaefer appeared to have slept 7.5 hours at the time of this note. Pt had no needs this shift. 15 minute safety checks in place. Will continue to monitor.

## 2015-07-07 NOTE — Unmapped (Signed)
Problem: Major Mood Disorder  Jocelyn Schaefer is having overwhelming that is impairing her ability to function and feel any sense of control   Goal: DC Criteria-Mania/depression won???t neg affect daily function  Jocelyn Schaefer will verbalize anxiety is alleviated and demonstrate this by improved daily functioning, ability to relax and improved ability to manage interpersonal relationships   Pt. exhibited a blunted affect and depressed mood.  She was observed to brighten with interaction.  Pt. Declined all unit programming this shift and was only minimally social with peers.  Pt. Was compliant with scheduled medications and consumed 100% of breakfast and 100% of meals.  She spent a majority of the shift napping and reading in her room.  Pt. Denied SI/HI and Jocelyn Schaefer is able to verbalize that she will remain safe at this time. Therefore, she may have their own clothes, standard linens and all silverware with meals. SRA = 13  Pt. Is scheduled to discharge after dinner this evening.    [facility]   Inpatient Shift Assessment    Name: Jocelyn Schaefer  MRN: [id number]  Admission date: 07/01/2015          Vitals:     Temp: 98.2 ??F (36.8 ??C)  Temp Source: Oral  Heart Rate: 116  Resp: 16  BP: 132/58 mmHg  BP Location: Right arm  BP Method: Automatic  Patient Position: Sitting  SpO2: 99 %  O2 Device: None (Room air)  Height: 5' 8 (172.7 cm)  Weight: (!) 233 lb (105.688 kg)  Weight Source: Stated Weight  BMI (Calculated): 35.5        Pain/ Pain Reassessment:     Pain Score: 0-No pain           Intake:              Output:              POCT Glucose:              Suicide Risk Assessmnet:     Suicidal Thoughts: None Present  Suicide Plan: None Present  Suicidal Intent: None Present  Ability to Engage for Safety Planning: Able and willing  History of Suicide Attempts: None Present  Lethality of Past-Self-Injurious Behavior (if more than one , score most sever): Superficial or non-suicidal self-injurious act without  injury requiring treatment  Depresssion: Mild, feels slightly down  Anxiety: Moderate, infrequent episodes of intense anxiety  Psychosis: Some mild delusion but reality testing intact  Alcohol/Drug Use: Infrequent or past use only, no excessive use  Anger/Impulsivity: None  Hopelessness/ Overwhelmed Feelings: Low, infrequent feelings  Medical Factors: Chronic or acute, with mild disruption of ADL  Resources of Support: Limited family/social resources  Situational Stressors: 1-2 stressors  Patient Score: 13  Observation Level: 15 minute checks  Assigned Risk: Moderate Risk          Mental Status Exam:     Apparent Age: Appears Actual Age  Hygiene/Grooming: Disheveled  General Attitude: Cooperative  Motor Activity: Unremarkable  Eye Contact: Appropriate  Facial Expression: Animated  Patient Behaviors: Cooperative  Impulsivity: Normal  Speech Pattern: Within Defined Limits  Mood: Within Defined Limits  Affect: Constricted  Affect congruent with mood: Yes  Content: Within Defined Limits  Delusions: Within Defined Limits  Perception: Appropriate  Hallucination: None  Thought content appropriate to situation: Yes  Danger to Others (WDL): Within Defined Limits  Thought process: Goals directed  Memory Impairment: None  Cognition: Ability to abstract  Orientation Level: Oriented X4  Attention Span: Poor concentration  Insight: Average  Judgement: Average  Appetite Change: Normal for patient  Do you have any sleep concerns?: Excessive sleeping  Libido: Normal          Edmonson Fall Risk     Age: Less than 50  Mental Status: Fully Alert/ Oriented at all times  Medication: No medications  Psych Diagnosis: Bipolar/ schizoafective disorder  Ambulation/ Balance: Independent/ Steady gait/ Immobile  Nutrition: No apparent abnormalities with appetite  Sleep Disturbance: Report of sleep disturbance by patient, family or staff  History of Falls: No history of falls  Secondary Diagnosis: No medical problems  Edmonson Fall Risk Score:  15               Patient Checks:     Interventions: ID band on  Visual Checks: Standard Q15  Arm Bands On: ID  Patient Checked for Contraband: Belongings checked          Safety:     Self Injurious Thoughts: Denies  Self Injurious Behaviors: None observed  Thoughts of Harming Others: Denies  Current Thoughts of Aggression: No  Elopement risk?: No  Methods to Calm Down: Quiet time in room;Reading          DASA     Irritablity: No  Verbal Threats: No  Impulsivity: No  Negative attitude: No  Unwillingness to follow direction: No  Sensitivity to perceived provocation: No  Easily angered when request denied: No  Total Score - DASA: 0        Withdrawl Symptoms:              Hygiene:     Bath/Shower: Independent  Oral Care: Independent  Level of Assistance: Independent        Nutrition Screen:     Feeding: Able to feed self  Diet Type: Regular  Appetite: Good        Jocelyn Jo, RN  07/07/2015  1:35 PM
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
